# Patient Record
Sex: Female | Born: 1980 | Race: White | Hispanic: No | Marital: Married | Smoking: Never smoker
Health system: Southern US, Community
[De-identification: ages and names within clinical notes are randomized; demographics above are authoritative.]

## PROBLEM LIST (undated history)

## (undated) DIAGNOSIS — O139 Gestational [pregnancy-induced] hypertension without significant proteinuria, unspecified trimester: Secondary | ICD-10-CM

---

## 2001-08-23 ENCOUNTER — Other Ambulatory Visit: Admission: RE | Admit: 2001-08-23 | Discharge: 2001-08-23 | Payer: Self-pay | Admitting: Family Medicine

## 2002-08-26 ENCOUNTER — Other Ambulatory Visit: Admission: RE | Admit: 2002-08-26 | Discharge: 2002-08-26 | Payer: Self-pay | Admitting: Family Medicine

## 2003-10-16 ENCOUNTER — Other Ambulatory Visit: Admission: RE | Admit: 2003-10-16 | Discharge: 2003-10-16 | Payer: Self-pay | Admitting: Family Medicine

## 2004-11-29 ENCOUNTER — Ambulatory Visit: Payer: Self-pay | Admitting: Family Medicine

## 2004-11-29 ENCOUNTER — Other Ambulatory Visit: Admission: RE | Admit: 2004-11-29 | Discharge: 2004-11-29 | Payer: Self-pay | Admitting: Family Medicine

## 2006-02-23 ENCOUNTER — Other Ambulatory Visit: Admission: RE | Admit: 2006-02-23 | Discharge: 2006-02-23 | Payer: Self-pay | Admitting: Family Medicine

## 2006-02-23 ENCOUNTER — Ambulatory Visit: Payer: Self-pay | Admitting: Family Medicine

## 2006-06-26 ENCOUNTER — Ambulatory Visit: Payer: Self-pay | Admitting: Family Medicine

## 2006-10-12 ENCOUNTER — Ambulatory Visit: Payer: Self-pay | Admitting: Family Medicine

## 2007-05-08 ENCOUNTER — Encounter: Payer: Self-pay | Admitting: Family Medicine

## 2007-05-08 DIAGNOSIS — T7840XA Allergy, unspecified, initial encounter: Secondary | ICD-10-CM | POA: Insufficient documentation

## 2007-05-08 DIAGNOSIS — E162 Hypoglycemia, unspecified: Secondary | ICD-10-CM

## 2007-05-21 ENCOUNTER — Ambulatory Visit: Payer: Self-pay | Admitting: Family Medicine

## 2007-05-21 ENCOUNTER — Other Ambulatory Visit: Admission: RE | Admit: 2007-05-21 | Discharge: 2007-05-21 | Payer: Self-pay | Admitting: Family Medicine

## 2007-05-21 ENCOUNTER — Encounter: Payer: Self-pay | Admitting: Family Medicine

## 2007-05-29 ENCOUNTER — Encounter (INDEPENDENT_AMBULATORY_CARE_PROVIDER_SITE_OTHER): Payer: Self-pay | Admitting: *Deleted

## 2007-05-29 LAB — CONVERTED CEMR LAB: Pap Smear: NORMAL

## 2007-07-10 ENCOUNTER — Ambulatory Visit: Payer: Self-pay | Admitting: Family Medicine

## 2007-07-10 LAB — CONVERTED CEMR LAB
Glucose, Urine, Semiquant: NEGATIVE
Specific Gravity, Urine: 1.015

## 2007-09-27 ENCOUNTER — Ambulatory Visit: Payer: Self-pay | Admitting: Family Medicine

## 2007-09-27 DIAGNOSIS — J069 Acute upper respiratory infection, unspecified: Secondary | ICD-10-CM | POA: Insufficient documentation

## 2007-09-27 DIAGNOSIS — N760 Acute vaginitis: Secondary | ICD-10-CM | POA: Insufficient documentation

## 2007-09-27 DIAGNOSIS — N390 Urinary tract infection, site not specified: Secondary | ICD-10-CM | POA: Insufficient documentation

## 2008-07-09 ENCOUNTER — Telehealth (INDEPENDENT_AMBULATORY_CARE_PROVIDER_SITE_OTHER): Payer: Self-pay | Admitting: *Deleted

## 2008-12-22 ENCOUNTER — Ambulatory Visit: Payer: Self-pay | Admitting: Family Medicine

## 2008-12-22 LAB — CONVERTED CEMR LAB
Casts: 0 /lpf
Glucose, Urine, Semiquant: NEGATIVE
Ketones, urine, test strip: NEGATIVE
Protein, U semiquant: NEGATIVE
RBC / HPF: 0
WBC Urine, dipstick: NEGATIVE
pH: 6.5

## 2009-01-20 ENCOUNTER — Other Ambulatory Visit: Admission: RE | Admit: 2009-01-20 | Discharge: 2009-01-20 | Payer: Self-pay | Admitting: Family Medicine

## 2009-01-20 ENCOUNTER — Ambulatory Visit: Payer: Self-pay | Admitting: Family Medicine

## 2009-01-20 ENCOUNTER — Encounter: Payer: Self-pay | Admitting: Family Medicine

## 2009-01-20 LAB — CONVERTED CEMR LAB
Casts: 0 /lpf
Epithelial cells, urine: 1 /lpf
Protein, U semiquant: NEGATIVE
Specific Gravity, Urine: 1.01
Urobilinogen, UA: 0.2
WBC Urine, dipstick: NEGATIVE
Yeast, UA: 0

## 2009-01-20 LAB — HM PAP SMEAR

## 2009-01-21 ENCOUNTER — Encounter: Payer: Self-pay | Admitting: Family Medicine

## 2009-01-23 LAB — CONVERTED CEMR LAB: Pap Smear: NORMAL

## 2009-01-25 LAB — CONVERTED CEMR LAB
AST: 17 units/L (ref 0–37)
BUN: 9 mg/dL (ref 6–23)
Basophils Relative: 0 % (ref 0.0–3.0)
Calcium: 9.4 mg/dL (ref 8.4–10.5)
Cholesterol: 170 mg/dL (ref 0–200)
Creatinine, Ser: 0.8 mg/dL (ref 0.4–1.2)
Eosinophils Absolute: 0.2 10*3/uL (ref 0.0–0.7)
Eosinophils Relative: 2.3 % (ref 0.0–5.0)
HDL: 39.5 mg/dL (ref >39.0)
Lymphocytes Relative: 30.2 % (ref 12.0–46.0)
MCV: 93.3 fL (ref 78.0–100.0)
Monocytes Absolute: 0.4 10*3/uL (ref 0.1–1.0)
Neutro Abs: 4.6 10*3/uL (ref 1.4–7.7)
Neutrophils Relative %: 62.7 % (ref 43.0–77.0)
Potassium: 3.4 meq/L — ABNORMAL LOW (ref 3.5–5.1)
RDW: 11.4 % — ABNORMAL LOW (ref 11.5–14.6)
Sodium: 140 meq/L (ref 135–145)
Total Protein: 6.9 g/dL (ref 6.0–8.3)
VLDL: 16 mg/dL (ref 0–40)
WBC: 7.4 10*3/uL (ref 4.5–10.5)

## 2009-01-26 ENCOUNTER — Encounter (INDEPENDENT_AMBULATORY_CARE_PROVIDER_SITE_OTHER): Payer: Self-pay | Admitting: *Deleted

## 2009-02-18 ENCOUNTER — Ambulatory Visit: Payer: Self-pay | Admitting: Family Medicine

## 2009-02-18 LAB — CONVERTED CEMR LAB
Bilirubin Urine: NEGATIVE
Glucose, Urine, Semiquant: NEGATIVE
Protein, U semiquant: NEGATIVE
Urobilinogen, UA: 0.2
WBC Urine, dipstick: NEGATIVE

## 2009-02-20 ENCOUNTER — Encounter: Payer: Self-pay | Admitting: Family Medicine

## 2009-02-22 DIAGNOSIS — R3129 Other microscopic hematuria: Secondary | ICD-10-CM | POA: Insufficient documentation

## 2009-03-11 ENCOUNTER — Encounter: Payer: Self-pay | Admitting: Family Medicine

## 2009-05-05 ENCOUNTER — Inpatient Hospital Stay (HOSPITAL_COMMUNITY): Admission: AD | Admit: 2009-05-05 | Discharge: 2009-05-05 | Payer: Self-pay | Admitting: Family Medicine

## 2009-05-08 ENCOUNTER — Inpatient Hospital Stay (HOSPITAL_COMMUNITY): Admission: AD | Admit: 2009-05-08 | Discharge: 2009-05-08 | Payer: Self-pay | Admitting: Obstetrics & Gynecology

## 2009-05-11 ENCOUNTER — Inpatient Hospital Stay (HOSPITAL_COMMUNITY): Admission: AD | Admit: 2009-05-11 | Discharge: 2009-05-11 | Payer: Self-pay | Admitting: Obstetrics & Gynecology

## 2009-05-14 ENCOUNTER — Inpatient Hospital Stay (HOSPITAL_COMMUNITY): Admission: AD | Admit: 2009-05-14 | Discharge: 2009-05-14 | Payer: Self-pay | Admitting: Obstetrics & Gynecology

## 2009-12-30 ENCOUNTER — Encounter: Payer: Self-pay | Admitting: Family Medicine

## 2010-05-17 ENCOUNTER — Inpatient Hospital Stay (HOSPITAL_COMMUNITY): Admission: AD | Admit: 2010-05-17 | Discharge: 2010-05-17 | Payer: Self-pay | Admitting: Obstetrics and Gynecology

## 2010-05-18 ENCOUNTER — Encounter (INDEPENDENT_AMBULATORY_CARE_PROVIDER_SITE_OTHER): Payer: Self-pay | Admitting: Obstetrics and Gynecology

## 2010-05-18 ENCOUNTER — Inpatient Hospital Stay (HOSPITAL_COMMUNITY): Admission: AD | Admit: 2010-05-18 | Discharge: 2010-05-21 | Payer: Self-pay | Admitting: Obstetrics and Gynecology

## 2010-11-16 IMAGING — US US OB TRANSVAGINAL MODIFY
1 series · 13 of 28 positions shown · non-contrast
Comparison: None available

CLINICAL DATA: Pregnant patient.  Quantitative HCG level not
changing.  Bleeding.  Beta HCG level is reportedly 21 today and was
23 on 05/05/2009.

OBSTETRIC <14 WK US AND TRANSVAGINAL OB US
TECHNIQUE: Both transabdominal and transvaginal ultrasound
examinations were performed for complete evaluation of the
gestation as well as the maternal uterus, adnexal regions, and
pelvic cul-de-sac.

[Series 1: us ob comp less 14 wks · 0.21mm/px · 13 of 38 slices shown]
[im 2/38]
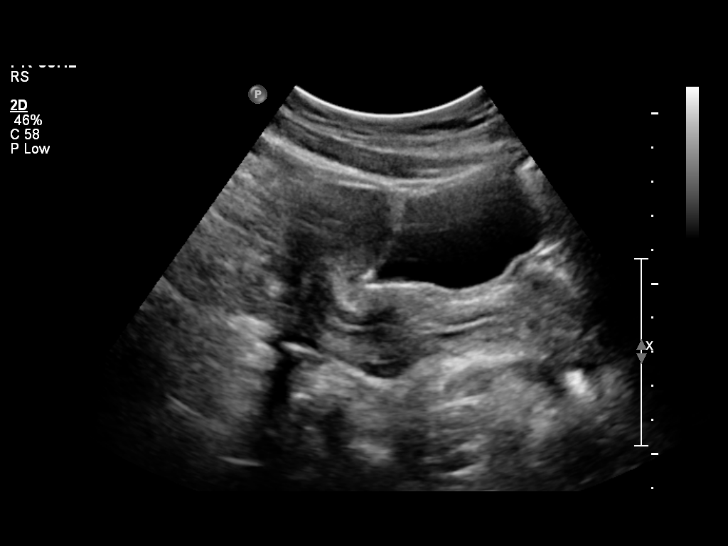
[im 5/38]
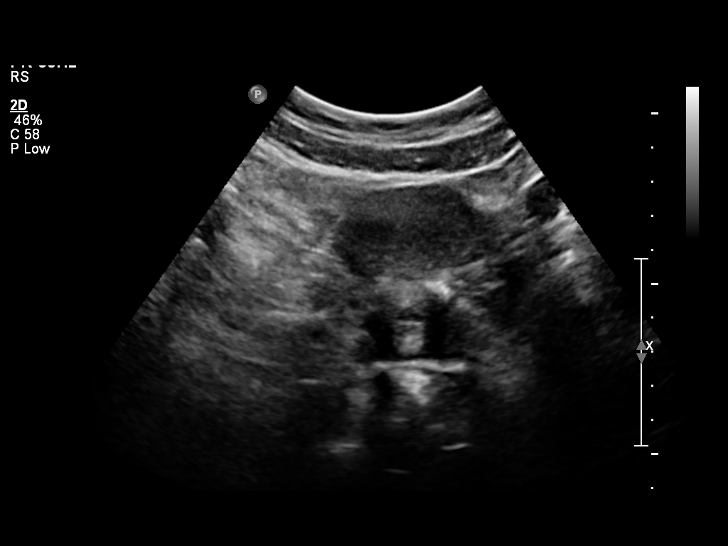
[im 7/38]
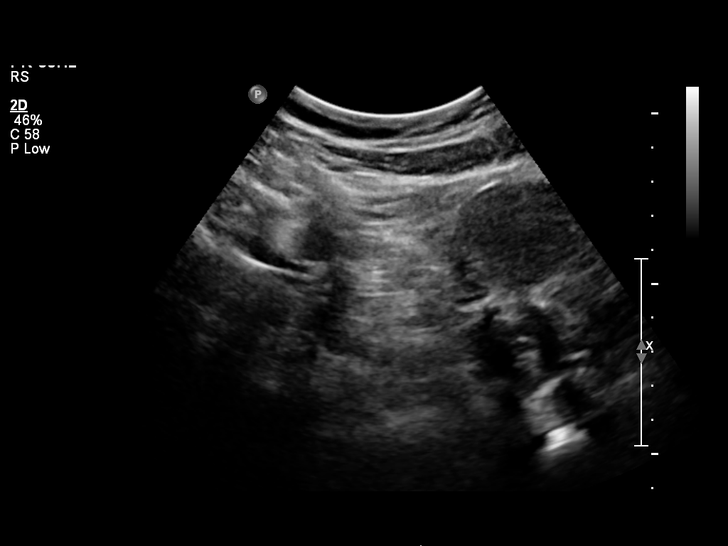
[im 10/38]
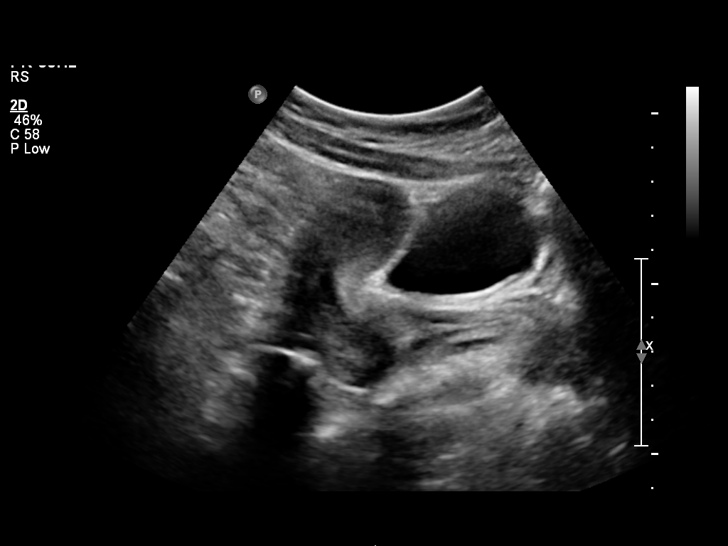
[im 13/38]
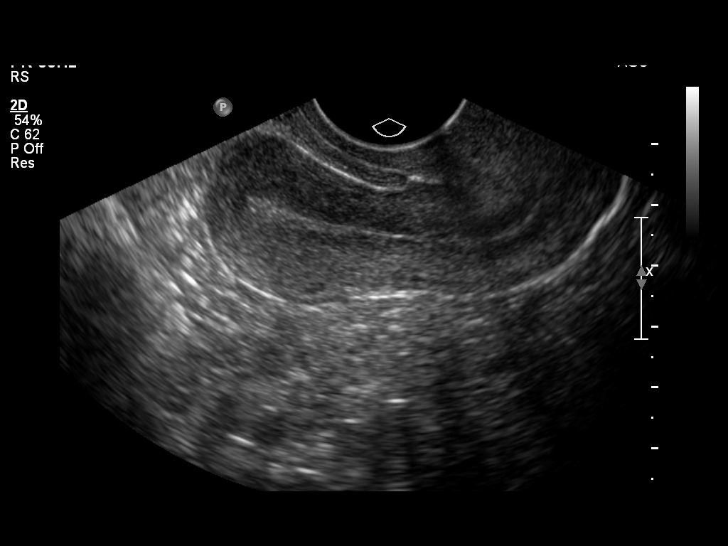
[im 16/38]
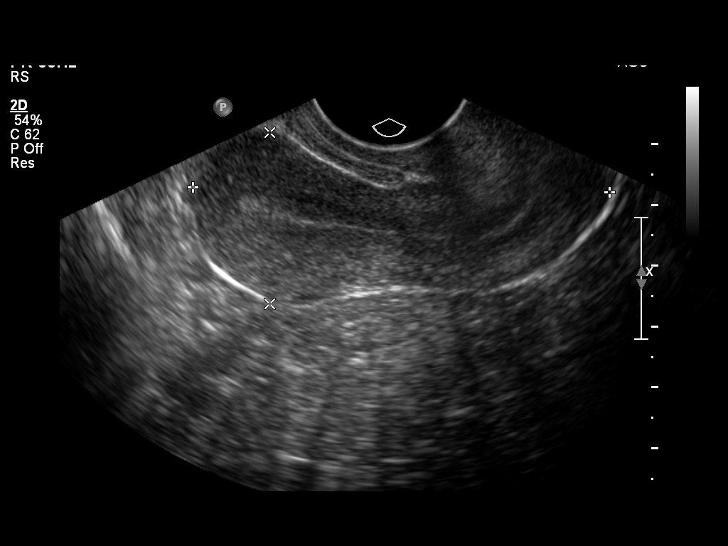
[im 20/38]
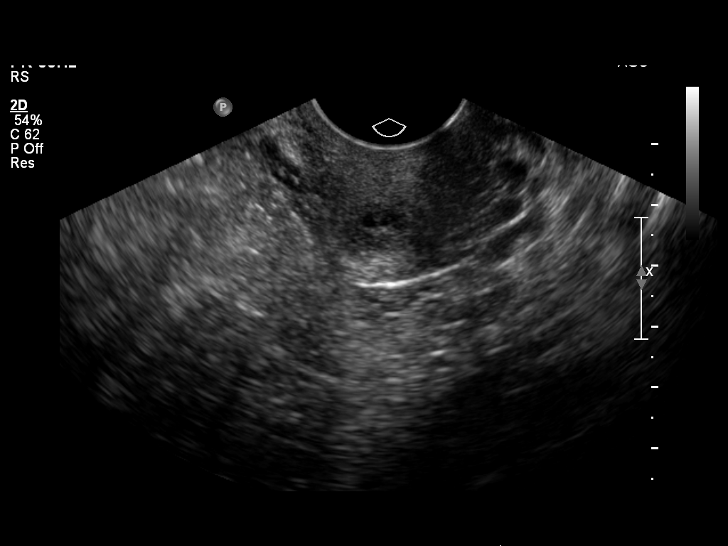
[im 22/38]
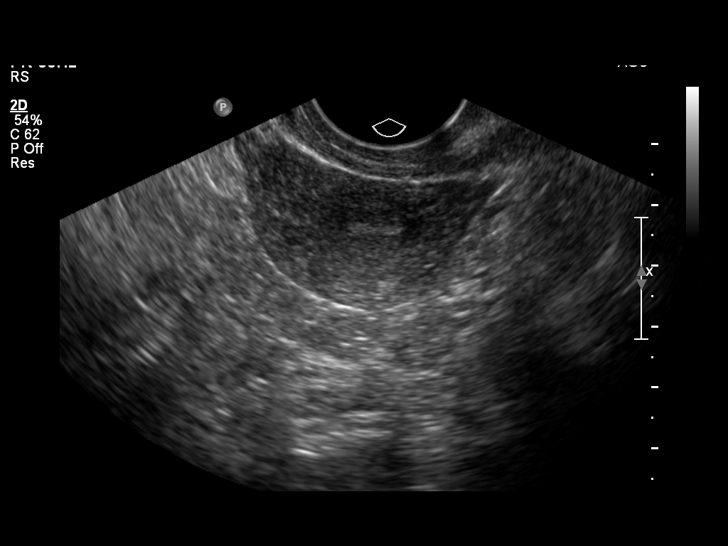
[im 25/38]
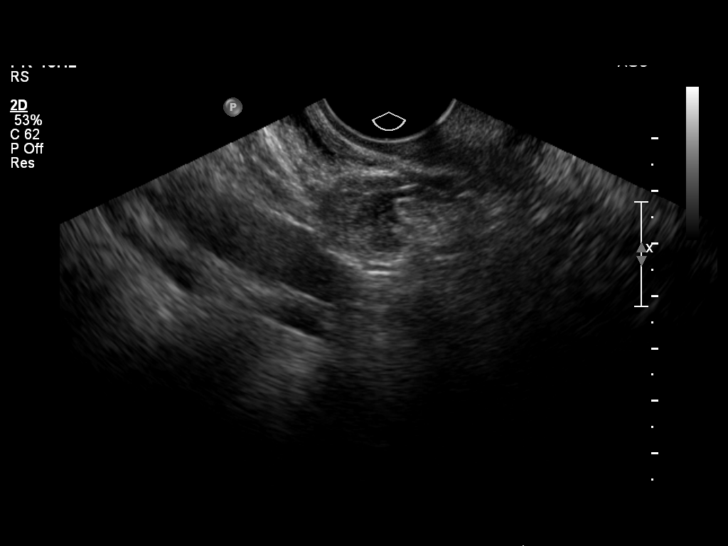
[im 28/38]
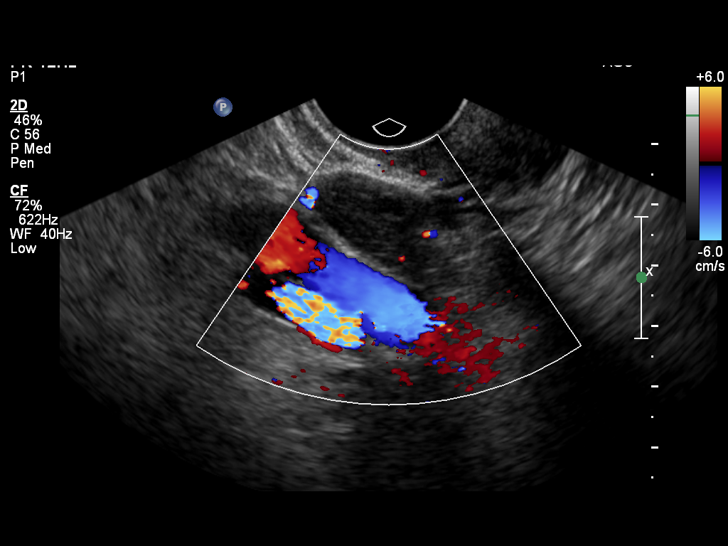
[im 31/38]
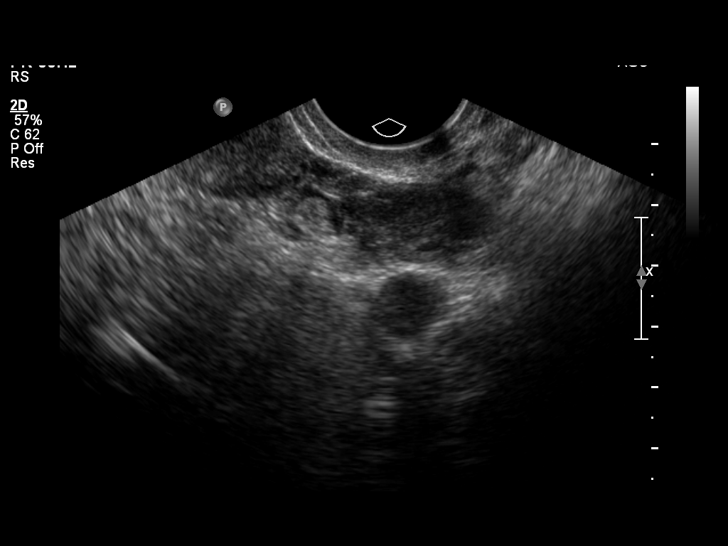
[im 33/38]
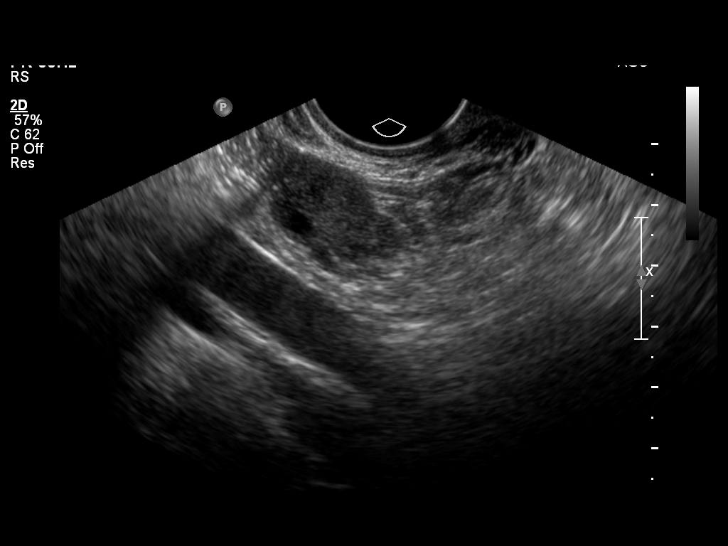
[im 36/38]
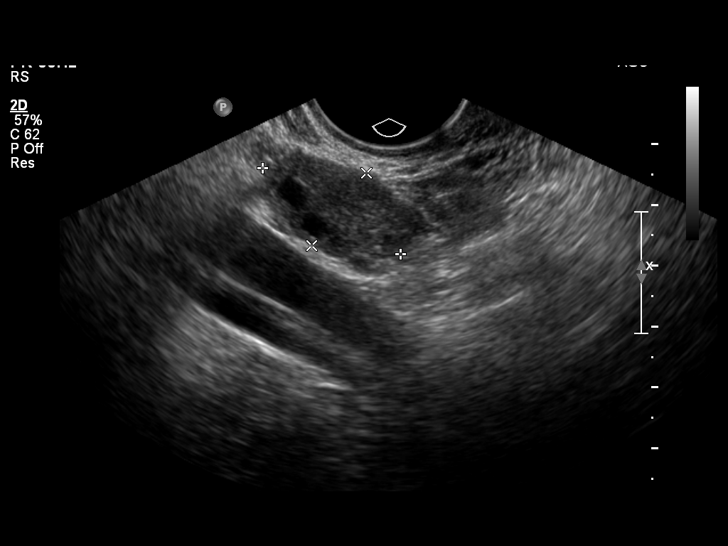

[13 of 28 positions shown; findings below may reference images not displayed]

Intrauterine gestational sac: None
Yolk sac: None
Embryo: None

Maternal uterus/adnexae:
The uterus is normal in size measuring 6.7 cm in length by
centers AP diameter by 3.7 cm transverse diameter.  No uterine mass
is identified.  The endometrial stripe is 1-2 mm in thickness.

Both maternal ovaries are normal in size and appearance, measuring
2.7 x 1.5 x 2.5 cm on the right and 2.6 x 1.4 x 1.8 cm on the left.
Normal appearing follicles are seen bilaterally.

No free pelvic fluid is identified.
IMPRESSION: No intrauterine pregnancy is identified.  Given the quantitative
beta HCG level of 21, we would not expect to see a gestational sac
at this time.  No adnexal mass or free fluid is seen.
Considerations include a normal early intrauterine pregnancy,
spontaneous abortion, or occult ectopic pregnancy.

## 2011-01-24 NOTE — Letter (Signed)
Summary: [facility]  [facility]   Imported By: Lanelle Bal 01/07/2010 14:13:18  _____________________________________________________________________  External Attachment:    Type:   Image     Comment:   External Document

## 2011-02-15 ENCOUNTER — Ambulatory Visit: Payer: Self-pay | Admitting: Family Medicine

## 2011-03-13 LAB — COMPREHENSIVE METABOLIC PANEL
ALT: 21 U/L (ref 0–35)
Albumin: 3.2 g/dL — ABNORMAL LOW (ref 3.5–5.2)
Alkaline Phosphatase: 106 U/L (ref 39–117)
BUN: 12 mg/dL (ref 6–23)
CO2: 22 mEq/L (ref 19–32)
Calcium: 9.5 mg/dL (ref 8.4–10.5)
Chloride: 106 mEq/L (ref 96–112)
Creatinine, Ser: 0.62 mg/dL (ref 0.4–1.2)
GFR calc non Af Amer: >60 mL/min (ref >60)
Sodium: 135 mEq/L (ref 135–145)
Total Bilirubin: 0.4 mg/dL (ref 0.3–1.2)
Total Protein: 6.7 g/dL (ref 6.0–8.3)

## 2011-03-13 LAB — CBC
HCT: 34.7 % — ABNORMAL LOW (ref 36.0–46.0)
HCT: 38.7 % (ref 36.0–46.0)
MCHC: 34.7 g/dL (ref 30.0–36.0)
MCHC: 34.7 g/dL (ref 30.0–36.0)
MCV: 94.6 fL (ref 78.0–100.0)
Platelets: 192 10*3/uL (ref 150–400)
RBC: 4.09 MIL/uL (ref 3.87–5.11)
RDW: 13.3 % (ref 11.5–15.5)
RDW: 13.4 % (ref 11.5–15.5)
WBC: 10.3 10*3/uL (ref 4.0–10.5)

## 2011-03-13 LAB — URINALYSIS, ROUTINE W REFLEX MICROSCOPIC
Nitrite: NEGATIVE
Protein, ur: NEGATIVE mg/dL

## 2011-03-13 LAB — URINE MICROSCOPIC-ADD ON

## 2011-03-13 LAB — RPR: RPR Ser Ql: NONREACTIVE

## 2011-04-04 LAB — CBC
HCT: 38.1 % (ref 36.0–46.0)
Hemoglobin: 13.5 g/dL (ref 12.0–15.0)
MCHC: 34.7 g/dL (ref 30.0–36.0)
MCV: 95.3 fL (ref 78.0–100.0)
RBC: 4 MIL/uL (ref 3.87–5.11)
RBC: 4.05 MIL/uL (ref 3.87–5.11)
WBC: 12.4 10*3/uL — ABNORMAL HIGH (ref 4.0–10.5)
WBC: 5.7 10*3/uL (ref 4.0–10.5)

## 2011-04-04 LAB — DIFFERENTIAL
Basophils Absolute: 0 10*3/uL (ref 0.0–0.1)
Basophils Relative: 1 % (ref 0–1)
Lymphocytes Relative: 28 % (ref 12–46)
Monocytes Absolute: 0.5 10*3/uL (ref 0.1–1.0)
Neutro Abs: 3.4 10*3/uL (ref 1.7–7.7)
Neutrophils Relative %: 60 % (ref 43–77)

## 2011-04-04 LAB — CREATININE, SERUM
Creatinine, Ser: 0.71 mg/dL (ref 0.4–1.2)
GFR calc Af Amer: >60 mL/min (ref >60)
GFR calc non Af Amer: >60 mL/min (ref >60)

## 2011-04-04 LAB — HCG, QUANTITATIVE, PREGNANCY
hCG, Beta Chain, Quant, S: 19 m[IU]/mL — ABNORMAL HIGH (ref <5)
hCG, Beta Chain, Quant, S: 21 m[IU]/mL — ABNORMAL HIGH (ref <5)
hCG, Beta Chain, Quant, S: 23 m[IU]/mL — ABNORMAL HIGH (ref <5)

## 2011-04-04 LAB — ABO/RH: ABO/RH(D): O POS

## 2011-04-04 LAB — BUN: BUN: 9 mg/dL (ref 6–23)

## 2011-04-04 LAB — AST: AST: 22 U/L (ref 0–37)

## 2011-04-04 LAB — WET PREP, GENITAL

## 2011-12-20 ENCOUNTER — Encounter: Payer: Self-pay | Admitting: Family Medicine

## 2011-12-20 ENCOUNTER — Ambulatory Visit (INDEPENDENT_AMBULATORY_CARE_PROVIDER_SITE_OTHER): Payer: BC Managed Care – PPO | Admitting: Family Medicine

## 2011-12-20 VITALS — BP 102/64 | HR 92 | Temp 98.3°F | Resp 20 | Ht 58.5 in | Wt 113.2 lb

## 2011-12-20 DIAGNOSIS — J0100 Acute maxillary sinusitis, unspecified: Secondary | ICD-10-CM | POA: Insufficient documentation

## 2011-12-20 DIAGNOSIS — J40 Bronchitis, not specified as acute or chronic: Secondary | ICD-10-CM

## 2011-12-20 MED ORDER — GUAIFENESIN-CODEINE 100-10 MG/5ML PO SYRP: 10.0000 mL | ORAL_SOLUTION | Freq: Every evening | ORAL | Status: AC | PRN

## 2011-12-20 MED ORDER — BENZONATATE 200 MG PO CAPS: 200.0000 mg | ORAL_CAPSULE | Freq: Three times a day (TID) | ORAL | Status: AC | PRN

## 2011-12-20 NOTE — Patient Instructions (Signed)
Drink lots of fluids and get rest  Take tessalon during the day  Take robitussin ac at night - caution of sedation If worse / fever or not starting to improve in a week please call

## 2011-12-20 NOTE — Assessment & Plan Note (Signed)
Acute/ viral with persistant/ cyclic cough and reassuring exam  tx cough aggressively with tessalon in day and guifen cod in pm  Fluids/ rest Disc symptomatic care - see instructions on AVS  Adv to update if worse or not improving

## 2011-12-20 NOTE — Progress Notes (Signed)
  Subjective:    Patient ID: Jade Lowe, female    DOB: 1981-01-30, 30 y.o.   MRN: [id number]  HPI Started getting uri symptoms 3 weeks ago - and cough is just not getting better Just a little bit prod today- yellow and dark looking  Throat is raw from coughing - hurts a bit to swallow A fever Monday night -- ? Mild  No fever now  No ear pain  No n/v/d  Tried otc meds - see list -- day quil works the best  Nose is not too congested  No headache   Patient Active Problem List  Diagnoses  . HYPOGLYCEMIA  . MICROSCOPIC HEMATURIA  . ALLERGY  . Bronchitis   No past medical history on file. No past surgical history on file. History  Substance Use Topics  . Smoking status: Never Smoker   . Smokeless tobacco: Not on file  . Alcohol Use: No   Family History  Problem Relation Age of Onset  . Diabetes Mother   . Cancer Paternal Aunt     Bca (? breast cancer abbreviation)   No Known Allergies No current outpatient prescriptions on file prior to visit.      Review of Systems Review of Systems  Constitutional: Negative for fever, appetite change, fatigue and unexpected weight change.  Eyes: Negative for pain and visual disturbance.  ENT neg for ear pain / neck pain or eye redness or d/c Respiratory: Negative for sob or wheeze or stridor  Cardiovascular: Negative for cp or palpitations    Gastrointestinal: Negative for nausea, diarrhea and constipation.  Genitourinary: Negative for urgency and frequency.  Skin: Negative for pallor or rash   Neurological: Negative for weakness, light-headedness, numbness and headaches.  Hematological: Negative for adenopathy. Does not bruise/bleed easily.  Psychiatric/Behavioral: Negative for dysphoric mood. The patient is not nervous/anxious.          Objective:   Physical Exam  Constitutional: She appears well-developed and well-nourished. No distress.       Harsh cough  HENT:  Head: Normocephalic and atraumatic.  Right Ear:  External ear normal.  Left Ear: External ear normal.  Mouth/Throat: Oropharynx is clear and moist.       Nares are injected and congested  No sinus tenderness Some post nasal drainage that is clear  Eyes: Conjunctivae and EOM are normal. Pupils are equal, round, and reactive to light. Right eye exhibits no discharge. Left eye exhibits no discharge.  Neck: Normal range of motion. Neck supple. No JVD present. No thyromegaly present.  Cardiovascular: Normal rate, regular rhythm and normal heart sounds.   Pulmonary/Chest: Effort normal and breath sounds normal. No respiratory distress. She has no wheezes. She has no rales. She exhibits no tenderness.       bs are harsh at bases No rhonchi or wheeze  Lymphadenopathy:    She has no cervical adenopathy.  Skin: Skin is warm and dry. No rash noted. No erythema. No pallor.  Psychiatric: She has a normal mood and affect.          Assessment & Plan:

## 2012-11-19 ENCOUNTER — Ambulatory Visit (INDEPENDENT_AMBULATORY_CARE_PROVIDER_SITE_OTHER): Payer: BC Managed Care – PPO | Admitting: Family Medicine

## 2012-11-19 ENCOUNTER — Encounter: Payer: Self-pay | Admitting: Family Medicine

## 2012-11-19 VITALS — BP 122/84 | HR 68 | Temp 98.3°F | Wt 119.0 lb

## 2012-11-19 DIAGNOSIS — J0100 Acute maxillary sinusitis, unspecified: Secondary | ICD-10-CM

## 2012-11-19 MED ORDER — AMOXICILLIN-POT CLAVULANATE 875-125 MG PO TABS: 1.0000 | ORAL_TABLET | Freq: Two times a day (BID) | ORAL | Status: AC

## 2012-11-19 NOTE — Assessment & Plan Note (Signed)
Given duration anticipate bacterial sinusitis. Will treat with augmentin course for 10 days Supportive care as per instructions. Pt agrees with plan, if worsening to notify us.

## 2012-11-19 NOTE — Patient Instructions (Signed)
You have a sinus infection. Take medicine as prescribed: augmentin for 10 day course Push fluids and plenty of rest. Nasal saline irrigation or neti pot to help drain sinuses. May use simple mucinex with plenty of fluid to help mobilize mucous. Let us know if fever >101.5, trouble opening/closing mouth, difficulty swallowing, or worsening - you may need to be seen again.

## 2012-11-19 NOTE — Progress Notes (Signed)
  Subjective:    Patient ID: Jade Lowe, female    DOB: 03/14/1981, 31 y.o.   MRN: [id number]  HPI CC: sinusitis?  9d h/o sinus congestion, PNDrainage worse in am, sinus pressure.  Started with ST.  Some nausea in am.  No cough, fevers/chills, abd pain, n, ear or tooth pain.  Tried mucinex, didn't help.  Also taking dayquil and tylenol cold/flu.  No smokers at home.  Husband smokes outside. No h/o asthma. Daughter recently with RN (2yo).  On OCP, was on abx 2 wks ago for UTI (macrobid) Doesn't think could be pregnant - LMP was last week, only lasted 1 day, very light.  No past medical history on file.   Review of Systems Per HPI    Objective:   Physical Exam  Nursing note and vitals reviewed. Constitutional: She appears well-developed and well-nourished. No distress.  HENT:  Head: Normocephalic and atraumatic.  Right Ear: Hearing, tympanic membrane, external ear and ear canal normal.  Left Ear: Hearing, tympanic membrane, external ear and ear canal normal.  Nose: No mucosal edema or rhinorrhea. Right sinus exhibits maxillary sinus tenderness. Right sinus exhibits no frontal sinus tenderness. Left sinus exhibits maxillary sinus tenderness. Left sinus exhibits no frontal sinus tenderness.  Mouth/Throat: Uvula is midline, oropharynx is clear and moist and mucous membranes are normal. No oropharyngeal exudate, posterior oropharyngeal edema, posterior oropharyngeal erythema or tonsillar abscesses.  Eyes: Conjunctivae normal and EOM are normal. Pupils are equal, round, and reactive to light. No scleral icterus.  Neck: Normal range of motion. Neck supple.  Cardiovascular: Normal rate, regular rhythm, normal heart sounds and intact distal pulses.   No murmur heard. Pulmonary/Chest: Effort normal and breath sounds normal. No respiratory distress. She has no wheezes. She has no rales.  Lymphadenopathy:    She has no cervical adenopathy.  Skin: Skin is warm and dry. No rash noted.        Assessment & Plan:

## 2013-09-25 LAB — OB RESULTS CONSOLE ABO/RH: RH TYPE: POSITIVE

## 2013-09-25 LAB — OB RESULTS CONSOLE HEPATITIS B SURFACE ANTIGEN: HEP B S AG: NEGATIVE

## 2013-09-25 LAB — OB RESULTS CONSOLE RUBELLA ANTIBODY, IGM: RUBELLA: IMMUNE

## 2013-09-25 LAB — OB RESULTS CONSOLE HIV ANTIBODY (ROUTINE TESTING): HIV: NONREACTIVE

## 2013-09-25 LAB — OB RESULTS CONSOLE ANTIBODY SCREEN: ANTIBODY SCREEN: NEGATIVE

## 2013-09-25 LAB — OB RESULTS CONSOLE RPR: RPR: NONREACTIVE

## 2014-03-16 ENCOUNTER — Encounter: Payer: Self-pay | Admitting: Internal Medicine

## 2014-03-16 ENCOUNTER — Ambulatory Visit (INDEPENDENT_AMBULATORY_CARE_PROVIDER_SITE_OTHER): Payer: BC Managed Care – PPO | Admitting: Internal Medicine

## 2014-03-16 VITALS — BP 104/64 | HR 96 | Temp 98.1°F | Wt 146.8 lb

## 2014-03-16 DIAGNOSIS — J019 Acute sinusitis, unspecified: Secondary | ICD-10-CM

## 2014-03-16 MED ORDER — AMOXICILLIN-POT CLAVULANATE 875-125 MG PO TABS: 1.0000 | ORAL_TABLET | Freq: Two times a day (BID) | ORAL | Status: DC

## 2014-03-16 NOTE — Progress Notes (Signed)
HPI  Pt presents to the clinic today with c/o head congestion, sinus pain and pressure and cough. She reports this started 3 weeks ago. The cough is productive of thick yellow mucous. She is having fever but denies chills or body aches. She has tried delsym and Mucinex. She has no history of allergies or breathing problems. She does not smoke.  Review of Systems   History reviewed. No pertinent past medical history.  Family History  Problem Relation Age of Onset  . Diabetes Mother   . Cancer Paternal Aunt     Bca (? breast cancer abbreviation)    History   Social History  . Marital Status: Married    Spouse Name: N/A    Number of Children: N/A  . Years of Education: N/A   Occupational History  . Not on file.   Social History Main Topics  . Smoking status: Never Smoker   . Smokeless tobacco: Never Used  . Alcohol Use: No  . Drug Use: No  . Sexual Activity: Not on file   Other Topics Concern  . Not on file   Social History Narrative  . No narrative on file    No Known Allergies   Constitutional: Positive headache, fatigue and fever. Denies abrupt weight changes.  HEENT:  Positive eye pain, pressure behind the eyes, facial pain, nasal congestion and sore throat. Denies eye redness, ear pain, ringing in the ears, wax buildup, runny nose or bloody nose. Respiratory: Positive cough. Denies difficulty breathing or shortness of breath.  Cardiovascular: Denies chest pain, chest tightness, palpitations or swelling in the hands or feet.   No other specific complaints in a complete review of systems (except as listed in HPI above).  Objective:  BP 104/64  Pulse 96  Temp(Src) 98.1 F (36.7 C) (Oral)  Wt 146 lb 12 oz (66.565 kg)  SpO2 97%   General: Appears his stated age, well developed, well nourished in NAD. HEENT: Head: normal shape and size, maxillarysinus tenderness noted; Eyes: sclera white, no icterus, conjunctiva pink, PERRLA and EOMs intact; Ears: Tm's gray and  intact, normal light reflex; Nose: mucosa pink and moist, septum midline; Throat/Mouth: + PND. Teeth present, mucosa pink and moist, no exudate noted, no lesions or ulcerations noted.  Neck: Neck supple, trachea midline. No massses, lumps or thyromegaly present.  Cardiovascular: Normal rate and rhythm. S1,S2 noted.  No murmur, rubs or gallops noted. No JVD or BLE edema. No carotid bruits noted. Pulmonary/Chest: Normal effort and positive vesicular breath sounds. No respiratory distress. No wheezes, rales or ronchi noted.      Assessment & Plan:   Acute bacterial sinusitis  Can use a Neti Pot which can be purchased from your local drug store. Augmentin BID for 10 days  RTC as needed or if symptoms persist.

## 2014-03-16 NOTE — Patient Instructions (Addendum)

## 2014-03-16 NOTE — Progress Notes (Signed)
Pre visit review using our clinic review tool, if applicable. No additional management support is needed unless otherwise documented below in the visit note. 

## 2014-04-02 ENCOUNTER — Ambulatory Visit (INDEPENDENT_AMBULATORY_CARE_PROVIDER_SITE_OTHER): Payer: BC Managed Care – PPO | Admitting: Internal Medicine

## 2014-04-02 ENCOUNTER — Encounter: Payer: Self-pay | Admitting: Internal Medicine

## 2014-04-02 VITALS — BP 106/70 | HR 85 | Temp 98.0°F | Wt 148.0 lb

## 2014-04-02 DIAGNOSIS — B309 Viral conjunctivitis, unspecified: Secondary | ICD-10-CM

## 2014-04-02 DIAGNOSIS — J309 Allergic rhinitis, unspecified: Secondary | ICD-10-CM

## 2014-04-02 NOTE — Patient Instructions (Addendum)
Viral Conjunctivitis °Conjunctivitis is an irritation (inflammation) of the clear membrane that covers the white part of the eye (the conjunctiva). The irritation can also happen on the underside of the eyelids. Conjunctivitis makes the eye red or pink in color. This is what is commonly known as pink eye. Viral conjunctivitis can spread easily (contagious). °CAUSES  °· Infection from virus on the surface of the eye. °· Infection from the irritation or injury of nearby tissues such as the eyelids or cornea. °· More serious inflammation or infection on the inside of the eye. °· Other eye diseases. °· The use of certain eye medications. °SYMPTOMS  °The normally white color of the eye or the underside of the eyelid is usually pink or red in color. The pink eye is usually associated with irritation, tearing and some sensitivity to light. Viral conjunctivitis is often associated with a clear, watery discharge. If a discharge is present, there may also be some blurred vision in the affected eye. °DIAGNOSIS  °Conjunctivitis is diagnosed by an eye exam. The eye specialist looks for changes in the surface tissues of the eye which take on changes characteristic of the specific types of conjunctivitis. A sample of any discharge may be collected on a Q-Tip (sterile swap). The sample will be sent to a lab to see whether or not the inflammation is caused by bacterial or viral infection. °TREATMENT  °Viral conjunctivitis will not respond to medicines that kill germs (antibiotics). Treatment is aimed at stopping a bacterial infection on top of the viral infection. The goal of treatment is to relieve symptoms (such as itching) with antihistamine drops or other eye medications.  °HOME CARE INSTRUCTIONS  °· To ease discomfort, apply a cool, clean wash cloth to your eye for 10 to 20 minutes, 3 to 4 times a day. °· Gently wipe away any drainage from the eye with a warm, wet washcloth or a cotton ball. °· Wash your hands often with soap  and use paper towels to dry. °· Do not share towels or washcloths. This may spread the infection. °· Change or wash your pillowcase every day. °· You should not use eye make-up until the infection is gone. °· Stop using contacts lenses. Ask your eye professional how to sterilize or replace them before using again. This depends on the type of contact lenses used. °· Do not touch the edge of the eyelid with the eye drop bottle or ointment tube when applying medications to the affected eye. This will stop you from spreading the infection to the other eye or to others. °SEEK IMMEDIATE MEDICAL CARE IF:  °· The infection has not improved within 3 days of beginning treatment. °· A watery discharge from the eye develops. °· Pain in the eye increases. °· The redness is spreading. °· Vision becomes blurred. °· An oral temperature above 102° F (38.9° C) develops, or as your caregiver suggests. °· Facial pain, redness or swelling develops. °· Any problems that may be related to the prescribed medicine develop. °MAKE SURE YOU:  °· Understand these instructions. °· Will watch your condition. °· Will get help right away if you are not doing well or get worse. °Document Released: 12/11/2005 Document Revised: 03/04/2012 Document Reviewed: 07/30/2008 °[employer]® Patient Information ©2014 [employer]. ° °

## 2014-04-02 NOTE — Progress Notes (Signed)
HPI  Pt presents to the clinic today wit hc/o recurrent facial pain and pressure and headache. She reports this started 4 days ago. She has also noticed her eyes are red and irritated. She has noticed that she has waken up with her eyes crusted shut. She was recently treated for a sinus infection 03/16/14 with amoxicillin. Her OB also called her in some tessalon pearls. She denies fever, chills, body aches. She has not had sick contacts.  Review of Systems   History reviewed. No pertinent past medical history.  Family History  Problem Relation Age of Onset  . Diabetes Mother   . Cancer Paternal Aunt     Bca (? breast cancer abbreviation)    History   Social History  . Marital Status: Married    Spouse Name: N/A    Number of Children: N/A  . Years of Education: N/A   Occupational History  . Not on file.   Social History Main Topics  . Smoking status: Never Smoker   . Smokeless tobacco: Never Used  . Alcohol Use: No  . Drug Use: No  . Sexual Activity: Not on file   Other Topics Concern  . Not on file   Social History Narrative  . No narrative on file    No Known Allergies   Constitutional: Positive headache, fatigue. Denies fever or abrupt weight changes.  HEENT:  Positive eye pain, pressure behind the eyes, facial pain, nasal congestion and sore throat. Denies ear pain, ringing in the ears, wax buildup, runny nose or bloody nose. Respiratory: Positive cough. Denies difficulty breathing or shortness of breath.  Cardiovascular: Denies chest pain, chest tightness, palpitations or swelling in the hands or feet.   No other specific complaints in a complete review of systems (except as listed in HPI above).  Objective:    General: Appears erhstated age, pregnant in NAD. HEENT: Head: normal shape and size, sinus tenderness noted; Eyes: sclera injected, no icterus, conjunctiva erythematous, PERRLA and EOMs intact; Ears: Tm's gray and intact, normal light reflex; Nose:  mucosa pink and moist, septum midline; Throat/Mouth: + PND. Teeth present, mucosa pink and moist, no exudate noted, no lesions or ulcerations noted.  Neck: Neck supple, trachea midline. No massses, lumps or thyromegaly present.  Cardiovascular: Normal rate and rhythm. S1,S2 noted.  No murmur, rubs or gallops noted. No JVD or BLE edema. No carotid bruits noted. Pulmonary/Chest: Normal effort and positive vesicular breath sounds. No respiratory distress. No wheezes, rales or ronchi noted.      Assessment & Plan:   Allergic Rhinitis  Can use a Neti Pot which can be purchased from your local drug store. Flonase 2 sprays each nostril for 3 days and then as needed. Continue Tessalon for now  Viral conjunctivitis:  Advised warm compresses TID No indication for abx eye ointment  RTC as needed   RTC as needed or if symptoms persist.

## 2014-04-02 NOTE — Progress Notes (Signed)
Pre visit review using our clinic review tool, if applicable. No additional management support is needed unless otherwise documented below in the visit note. 

## 2014-04-08 LAB — OB RESULTS CONSOLE GBS: STREP GROUP B AG: NEGATIVE

## 2014-04-16 ENCOUNTER — Encounter (HOSPITAL_COMMUNITY): Payer: Self-pay | Admitting: Pharmacist

## 2014-04-22 NOTE — H&P (Signed)
Jaymes GraffSarah E Wenzler is a 33 y.o. female presenting for repeat cesarean section. History OB History   Grav Para Term Preterm Abortions TAB SAB Ect Mult Living   1              No past medical history on file. No past surgical history on file. Family History: family history includes Cancer in her paternal aunt; Diabetes in her mother. Social History:  reports that she has never smoked. She has never used smokeless tobacco. She reports that she does not drink alcohol or use illicit drugs.   Prenatal Transfer Tool  Maternal Diabetes: No Genetic Screening: Normal Maternal Ultrasounds/Referrals: Normal Fetal Ultrasounds or other Referrals:  None Maternal Substance Abuse:  No Significant Maternal Medications:  None Significant Maternal Lab Results:  None Other Comments:  None  Review of Systems  Eyes: Negative for blurred vision.  Gastrointestinal: Negative for vomiting.  Neurological: Negative for headaches.      There were no vitals taken for this visit. Exam Physical Exam  Cardiovascular: Normal rate and regular rhythm.   Respiratory: Effort normal and breath sounds normal.  GI: Soft. There is no tenderness.  Neurological: She has normal reflexes.    Prenatal labs: ABO, Rh:   Antibody:   Rubella:   RPR:    HBsAg:    HIV:    GBS:     Assessment/Plan: 33 yo G3P1 for repeat cesarean section. D/W patient risks including infection, organ damage, bleeding/transfusion-HIV/Hep, DVT/PE, pneumonia.   Roselle LocusJames E Abdou Stocks II 04/22/2014, 6:43 PM

## 2014-04-24 ENCOUNTER — Encounter (HOSPITAL_COMMUNITY): Payer: Self-pay | Admitting: *Deleted

## 2014-04-24 ENCOUNTER — Inpatient Hospital Stay (HOSPITAL_COMMUNITY)
Admission: AD | Admit: 2014-04-24 | Discharge: 2014-04-24 | Disposition: A | Discharge status: Home / Self Care | Payer: BC Managed Care – PPO | Source: Ambulatory Visit | Attending: Obstetrics and Gynecology | Admitting: Obstetrics and Gynecology

## 2014-04-24 ENCOUNTER — Encounter (HOSPITAL_COMMUNITY): Payer: Self-pay

## 2014-04-24 DIAGNOSIS — O479 False labor, unspecified: Secondary | ICD-10-CM | POA: Insufficient documentation

## 2014-04-24 DIAGNOSIS — O4690 Antepartum hemorrhage, unspecified, unspecified trimester: Secondary | ICD-10-CM | POA: Insufficient documentation

## 2014-04-24 HISTORY — DX: Gestational (pregnancy-induced) hypertension without significant proteinuria, unspecified trimester: O13.9

## 2014-04-24 NOTE — Patient Instructions (Signed)
   Your procedure is scheduled on:  Tuesday, May 5  [facility] at: 6 AM Pick up the phone at the desk and dial [phone] and inform us of your arrival.  Please call this number if you have any problems the morning of surgery: [phone]  Remember: Do not eat or drink after midnight: Monday Take these medicines the morning of surgery with a SIP OF WATER:  Do not wear jewelry, make-up, or FINGER nail polish No metal in your hair or on your body. Do not wear lotions, powders, perfumes.  You may wear deodorant.  Do not bring valuables to the hospital. Contacts, dentures or bridgework may not be worn into surgery.  Leave suitcase in the car. After Surgery it may be brought to your room. For patients being admitted to the hospital, checkout time is 11:00am the day of discharge.

## 2014-04-24 NOTE — MAU Note (Addendum)
PT  SAYS  SHE IS SCH FOR REP  C/S ON 5-5-     [facility] HURT BAD AT 9-10 PM.  STARTED   BLEEDING   ON Tuesday-  WENT TO DR ON WED-  VE CLOSED.   TONIGHT MORE  BLOODY  SHOW.      GBS- NEG.   DENIES HSV AND MRSA.

## 2014-04-24 NOTE — Discharge Instructions (Signed)
Braxton Hicks Contractions Pregnancy is commonly associated with contractions of the uterus throughout the pregnancy. Towards the end of pregnancy (32 to 34 weeks), these contractions (Braxton Hicks) can develop more often and may become more forceful. This is not true labor because these contractions do not result in opening (dilatation) and thinning of the cervix. They are sometimes difficult to tell apart from true labor because these contractions can be forceful and people have different pain tolerances. You should not feel embarrassed if you go to the hospital with false labor. Sometimes, the only way to tell if you are in true labor is for your caregiver to follow the changes in the cervix. How to tell the difference between true and false labor:  False labor.  The contractions of false labor are usually shorter, irregular and not as hard as those of true labor.  They are often felt in the front of the lower abdomen and in the groin.  They may leave with walking around or changing positions while lying down.  They get weaker and are shorter lasting as time goes on.  These contractions are usually irregular.  They do not usually become progressively stronger, regular and closer together as with true labor.  True labor.  Contractions in true labor last 30 to 70 seconds, become very regular, usually become more intense, and increase in frequency.  They do not go away with walking.  The discomfort is usually felt in the top of the uterus and spreads to the lower abdomen and low back.  True labor can be determined by your caregiver with an exam. This will show that the cervix is dilating and getting thinner. If there are no prenatal problems or other health problems associated with the pregnancy, it is completely safe to be sent home with false labor and await the onset of true labor. HOME CARE INSTRUCTIONS   Keep up with your usual exercises and instructions.  Take medications as  directed.  Keep your regular prenatal appointment.  Eat and drink lightly if you think you are going into labor.  If BH contractions are making you uncomfortable:  Change your activity position from lying down or resting to walking/walking to resting.  Sit and rest in a tub of warm water.  Drink 2 to 3 glasses of water. Dehydration may cause B-H contractions.  Do slow and deep breathing several times an hour. SEEK IMMEDIATE MEDICAL CARE IF:   Your contractions continue to become stronger, more regular, and closer together.  You have a gushing, burst or leaking of fluid from the vagina.  An oral temperature above 102 F (38.9 C) develops.  You have passage of blood-tinged mucus.  You develop vaginal bleeding.  You develop continuous belly (abdominal) pain.  You have low back pain that you never had before.  You feel the baby's head pushing down causing pelvic pressure.  The baby is not moving as much as it used to. Document Released: 12/11/2005 Document Revised: 03/04/2012 Document Reviewed: 09/22/2013 [employer] Patient Information 2014 [employer].  Fetal Movement Counts Patient Name: __________________________________________________ Patient Due Date: ____________________ Performing a fetal movement count is highly recommended in high-risk pregnancies, but it is good for every pregnant woman to do. Your caregiver may ask you to start counting fetal movements at 28 weeks of the pregnancy. Fetal movements often increase:  After eating a full meal.  After physical activity.  After eating or drinking something sweet or cold.  At rest. Pay attention to when you feel   the baby is most active. This will help you notice a pattern of your baby's sleep and wake cycles and what factors contribute to an increase in fetal movement. It is important to perform a fetal movement count at the same time each day when your baby is normally most active.  HOW TO COUNT FETAL  MOVEMENTS 1. Find a quiet and comfortable area to sit or lie down on your left side. Lying on your left side provides the best blood and oxygen circulation to your baby. 2. Write down the day and time on a sheet of paper or in a journal. 3. Start counting kicks, flutters, swishes, rolls, or jabs in a 2 hour period. You should feel at least 10 movements within 2 hours. 4. If you do not feel 10 movements in 2 hours, wait 2 3 hours and count again. Look for a change in the pattern or not enough counts in 2 hours. SEEK MEDICAL CARE IF:  You feel less than 10 counts in 2 hours, tried twice.  There is no movement in over an hour.  The pattern is changing or taking longer each day to reach 10 counts in 2 hours.  You feel the baby is not moving as he or she usually does. Date: ____________ Movements: ____________ Start time: ____________ Finish time: ____________  Date: ____________ Movements: ____________ Start time: ____________ Finish time: ____________ Date: ____________ Movements: ____________ Start time: ____________ Finish time: ____________ Date: ____________ Movements: ____________ Start time: ____________ Finish time: ____________ Date: ____________ Movements: ____________ Start time: ____________ Finish time: ____________ Date: ____________ Movements: ____________ Start time: ____________ Finish time: ____________ Date: ____________ Movements: ____________ Start time: ____________ Finish time: ____________ Date: ____________ Movements: ____________ Start time: ____________ Finish time: ____________  Date: ____________ Movements: ____________ Start time: ____________ Finish time: ____________ Date: ____________ Movements: ____________ Start time: ____________ Finish time: ____________ Date: ____________ Movements: ____________ Start time: ____________ Finish time: ____________ Date: ____________ Movements: ____________ Start time: ____________ Finish time: ____________ Date: ____________  Movements: ____________ Start time: ____________ Finish time: ____________ Date: ____________ Movements: ____________ Start time: ____________ Finish time: ____________ Date: ____________ Movements: ____________ Start time: ____________ Finish time: ____________  Date: ____________ Movements: ____________ Start time: ____________ Finish time: ____________ Date: ____________ Movements: ____________ Start time: ____________ Finish time: ____________ Date: ____________ Movements: ____________ Start time: ____________ Finish time: ____________ Date: ____________ Movements: ____________ Start time: ____________ Finish time: ____________ Date: ____________ Movements: ____________ Start time: ____________ Finish time: ____________ Date: ____________ Movements: ____________ Start time: ____________ Finish time: ____________ Date: ____________ Movements: ____________ Start time: ____________ Finish time: ____________  Date: ____________ Movements: ____________ Start time: ____________ Finish time: ____________ Date: ____________ Movements: ____________ Start time: ____________ Finish time: ____________ Date: ____________ Movements: ____________ Start time: ____________ Finish time: ____________ Date: ____________ Movements: ____________ Start time: ____________ Finish time: ____________ Date: ____________ Movements: ____________ Start time: ____________ Finish time: ____________ Date: ____________ Movements: ____________ Start time: ____________ Finish time: ____________ Date: ____________ Movements: ____________ Start time: ____________ Finish time: ____________  Date: ____________ Movements: ____________ Start time: ____________ Finish time: ____________ Date: ____________ Movements: ____________ Start time: ____________ Finish time: ____________ Date: ____________ Movements: ____________ Start time: ____________ Finish time: ____________ Date: ____________ Movements: ____________ Start time:  ____________ Finish time: ____________ Date: ____________ Movements: ____________ Start time: ____________ Finish time: ____________ Date: ____________ Movements: ____________ Start time: ____________ Finish time: ____________ Date: ____________ Movements: ____________ Start time: ____________ Finish time: ____________  Date: ____________ Movements: ____________ Start time: ____________ Finish time: ____________ Date: ____________ Movements: ____________ Start   time: ____________ Finish time: ____________ Date: ____________ Movements: ____________ Start time: ____________ Finish time: ____________ Date: ____________ Movements: ____________ Start time: ____________ Finish time: ____________ Date: ____________ Movements: ____________ Start time: ____________ Finish time: ____________ Date: ____________ Movements: ____________ Start time: ____________ Finish time: ____________ Date: ____________ Movements: ____________ Start time: ____________ Finish time: ____________  Date: ____________ Movements: ____________ Start time: ____________ Finish time: ____________ Date: ____________ Movements: ____________ Start time: ____________ Finish time: ____________ Date: ____________ Movements: ____________ Start time: ____________ Finish time: ____________ Date: ____________ Movements: ____________ Start time: ____________ Finish time: ____________ Date: ____________ Movements: ____________ Start time: ____________ Finish time: ____________ Date: ____________ Movements: ____________ Start time: ____________ Finish time: ____________ Date: ____________ Movements: ____________ Start time: ____________ Finish time: ____________  Date: ____________ Movements: ____________ Start time: ____________ Finish time: ____________ Date: ____________ Movements: ____________ Start time: ____________ Finish time: ____________ Date: ____________ Movements: ____________ Start time: ____________ Finish time: ____________ Date:  ____________ Movements: ____________ Start time: ____________ Finish time: ____________ Date: ____________ Movements: ____________ Start time: ____________ Finish time: ____________ Date: ____________ Movements: ____________ Start time: ____________ Finish time: ____________ Document Released: 01/10/2007 Document Revised: 11/27/2012 Document Reviewed: 10/07/2012 [employer] Patient Information 2014 [employer].  

## 2014-04-25 ENCOUNTER — Inpatient Hospital Stay (HOSPITAL_COMMUNITY)
Admission: AD | Admit: 2014-04-25 | Discharge: 2014-04-27 | DRG: 765 | Disposition: A | Discharge status: Home / Self Care | Payer: BC Managed Care – PPO | Source: Ambulatory Visit | Attending: Obstetrics & Gynecology | Admitting: Obstetrics & Gynecology

## 2014-04-25 ENCOUNTER — Inpatient Hospital Stay (HOSPITAL_COMMUNITY): Payer: BC Managed Care – PPO

## 2014-04-25 ENCOUNTER — Encounter (HOSPITAL_COMMUNITY)
Admission: AD | Disposition: A | Discharge status: Home / Self Care | Payer: Self-pay | Source: Ambulatory Visit | Attending: Obstetrics & Gynecology

## 2014-04-25 ENCOUNTER — Encounter (HOSPITAL_COMMUNITY): Payer: BC Managed Care – PPO

## 2014-04-25 ENCOUNTER — Encounter (HOSPITAL_COMMUNITY): Payer: Self-pay | Admitting: Family

## 2014-04-25 DIAGNOSIS — O1002 Pre-existing essential hypertension complicating childbirth: Secondary | ICD-10-CM | POA: Diagnosis present

## 2014-04-25 DIAGNOSIS — Z98891 History of uterine scar from previous surgery: Secondary | ICD-10-CM

## 2014-04-25 DIAGNOSIS — O34219 Maternal care for unspecified type scar from previous cesarean delivery: Principal | ICD-10-CM | POA: Diagnosis present

## 2014-04-25 DIAGNOSIS — Z833 Family history of diabetes mellitus: Secondary | ICD-10-CM

## 2014-04-25 LAB — CBC
HCT: 38.7 % (ref 36.0–46.0)
Hemoglobin: 13.2 g/dL (ref 12.0–15.0)
MCH: 30.7 pg (ref 26.0–34.0)
MCHC: 34.1 g/dL (ref 30.0–36.0)
MCV: 90 fL (ref 78.0–100.0)
PLATELETS: 231 10*3/uL (ref 150–400)
RBC: 4.3 MIL/uL (ref 3.87–5.11)
RDW: 14 % (ref 11.5–15.5)
WBC: 14.4 10*3/uL — AB (ref 4.0–10.5)

## 2014-04-25 SURGERY — Surgical Case
Anesthesia: Spinal

## 2014-04-25 SURGERY — Surgical Case
Anesthesia: Regional

## 2014-04-25 MED ORDER — ONDANSETRON HCL 4 MG/2ML IJ SOLN
INTRAMUSCULAR | Status: AC
  Filled 2014-04-25: qty 2

## 2014-04-25 MED ORDER — MEPERIDINE HCL 25 MG/ML IJ SOLN: 6.2500 mg | INTRAMUSCULAR | Status: DC | PRN

## 2014-04-25 MED ORDER — METOCLOPRAMIDE HCL 5 MG/ML IJ SOLN: 10.0000 mg | Freq: Three times a day (TID) | INTRAMUSCULAR | Status: DC | PRN

## 2014-04-25 MED ORDER — MENTHOL 3 MG MT LOZG: 1.0000 | LOZENGE | OROMUCOSAL | Status: DC | PRN

## 2014-04-25 MED ORDER — FAMOTIDINE IN NACL 20-0.9 MG/50ML-% IV SOLN
20.0000 mg | Freq: Once | INTRAVENOUS | Status: AC
  Administered 2014-04-25: 20 mg via INTRAVENOUS
  Filled 2014-04-25: qty 50

## 2014-04-25 MED ORDER — NALOXONE HCL 0.4 MG/ML IJ SOLN: 0.4000 mg | INTRAMUSCULAR | Status: DC | PRN

## 2014-04-25 MED ORDER — SENNOSIDES-DOCUSATE SODIUM 8.6-50 MG PO TABS
2.0000 | ORAL_TABLET | ORAL | Status: DC
  Administered 2014-04-25 – 2014-04-26 (×2): 2 via ORAL
  Filled 2014-04-25 (×2): qty 2

## 2014-04-25 MED ORDER — ONDANSETRON HCL 4 MG/2ML IJ SOLN: 4.0000 mg | INTRAMUSCULAR | Status: DC | PRN

## 2014-04-25 MED ORDER — DIPHENHYDRAMINE HCL 50 MG/ML IJ SOLN
12.5000 mg | INTRAMUSCULAR | Status: DC | PRN
Start: 2014-04-25 — End: 2014-04-27

## 2014-04-25 MED ORDER — NALBUPHINE HCL 10 MG/ML IJ SOLN: 5.0000 mg | INTRAMUSCULAR | Status: DC | PRN

## 2014-04-25 MED ORDER — TETANUS-DIPHTH-ACELL PERTUSSIS 5-2.5-18.5 LF-MCG/0.5 IM SUSP: 0.5000 mL | Freq: Once | INTRAMUSCULAR | Status: DC

## 2014-04-25 MED ORDER — OXYTOCIN 40 UNITS IN LACTATED RINGERS INFUSION - SIMPLE MED: 62.5000 mL/h | INTRAVENOUS | Status: AC

## 2014-04-25 MED ORDER — SIMETHICONE 80 MG PO CHEW
80.0000 mg | CHEWABLE_TABLET | ORAL | Status: DC
  Administered 2014-04-25 – 2014-04-26 (×2): 80 mg via ORAL
  Filled 2014-04-25 (×2): qty 1

## 2014-04-25 MED ORDER — DIPHENHYDRAMINE HCL 25 MG PO CAPS: 25.0000 mg | ORAL_CAPSULE | Freq: Four times a day (QID) | ORAL | Status: DC | PRN

## 2014-04-25 MED ORDER — ZOLPIDEM TARTRATE 5 MG PO TABS
5.0000 mg | ORAL_TABLET | Freq: Every evening | ORAL | Status: DC | PRN
Start: 2014-04-25 — End: 2014-04-27

## 2014-04-25 MED ORDER — SCOPOLAMINE 1 MG/3DAYS TD PT72
MEDICATED_PATCH | TRANSDERMAL | Status: AC
  Administered 2014-04-25: 1.5 mg via TRANSDERMAL
  Filled 2014-04-25: qty 1

## 2014-04-25 MED ORDER — ONDANSETRON HCL 4 MG PO TABS: 4.0000 mg | ORAL_TABLET | ORAL | Status: DC | PRN

## 2014-04-25 MED ORDER — ONDANSETRON HCL 4 MG/2ML IJ SOLN: 4.0000 mg | Freq: Three times a day (TID) | INTRAMUSCULAR | Status: DC | PRN

## 2014-04-25 MED ORDER — LACTATED RINGERS IV SOLN
INTRAVENOUS | Status: DC
  Administered 2014-04-25 (×3): via INTRAVENOUS

## 2014-04-25 MED ORDER — PRENATAL MULTIVITAMIN CH
1.0000 | ORAL_TABLET | Freq: Every day | ORAL | Status: DC
  Administered 2014-04-26: 1 via ORAL
  Filled 2014-04-25: qty 1

## 2014-04-25 MED ORDER — SODIUM CHLORIDE 0.9 % IJ SOLN: 3.0000 mL | INTRAMUSCULAR | Status: DC | PRN

## 2014-04-25 MED ORDER — DEXTROSE IN LACTATED RINGERS 5 % IV SOLN: INTRAVENOUS | Status: DC

## 2014-04-25 MED ORDER — DIBUCAINE 1 % RE OINT: 1.0000 "application " | TOPICAL_OINTMENT | RECTAL | Status: DC | PRN

## 2014-04-25 MED ORDER — CEFAZOLIN SODIUM-DEXTROSE 2-3 GM-% IV SOLR
2.0000 g | INTRAVENOUS | Status: AC
  Administered 2014-04-25: 2 g via INTRAVENOUS
  Filled 2014-04-25: qty 50

## 2014-04-25 MED ORDER — PHENYLEPHRINE 8 MG IN D5W 100 ML (0.08MG/ML) PREMIX OPTIME
INJECTION | INTRAVENOUS | Status: DC | PRN
  Administered 2014-04-25: 60 ug/min via INTRAVENOUS

## 2014-04-25 MED ORDER — DIPHENHYDRAMINE HCL 50 MG/ML IJ SOLN: 25.0000 mg | INTRAMUSCULAR | Status: DC | PRN

## 2014-04-25 MED ORDER — FENTANYL CITRATE 0.05 MG/ML IJ SOLN
INTRAMUSCULAR | Status: DC | PRN
  Administered 2014-04-25: 25 ug via INTRATHECAL

## 2014-04-25 MED ORDER — SIMETHICONE 80 MG PO CHEW
80.0000 mg | CHEWABLE_TABLET | Freq: Three times a day (TID) | ORAL | Status: DC
  Administered 2014-04-25 – 2014-04-26 (×3): 80 mg via ORAL
  Filled 2014-04-25 (×4): qty 1

## 2014-04-25 MED ORDER — LANOLIN HYDROUS EX OINT: 1.0000 "application " | TOPICAL_OINTMENT | CUTANEOUS | Status: DC | PRN

## 2014-04-25 MED ORDER — IBUPROFEN 600 MG PO TABS
600.0000 mg | ORAL_TABLET | Freq: Four times a day (QID) | ORAL | Status: DC
  Administered 2014-04-25 – 2014-04-27 (×7): 600 mg via ORAL
  Filled 2014-04-25 (×7): qty 1

## 2014-04-25 MED ORDER — SCOPOLAMINE 1 MG/3DAYS TD PT72
1.0000 | MEDICATED_PATCH | Freq: Once | TRANSDERMAL | Status: AC
  Administered 2014-04-25: 1.5 mg via TRANSDERMAL

## 2014-04-25 MED ORDER — OXYTOCIN 10 UNIT/ML IJ SOLN
40.0000 [IU] | INTRAVENOUS | Status: DC | PRN
  Administered 2014-04-25: 40 [IU] via INTRAVENOUS

## 2014-04-25 MED ORDER — ONDANSETRON HCL 4 MG/2ML IJ SOLN
INTRAMUSCULAR | Status: DC | PRN
  Administered 2014-04-25: 4 mg via INTRAVENOUS

## 2014-04-25 MED ORDER — OXYTOCIN 10 UNIT/ML IJ SOLN
INTRAMUSCULAR | Status: AC
  Filled 2014-04-25: qty 4

## 2014-04-25 MED ORDER — KETOROLAC TROMETHAMINE 30 MG/ML IJ SOLN: 30.0000 mg | Freq: Four times a day (QID) | INTRAMUSCULAR | Status: AC | PRN

## 2014-04-25 MED ORDER — PHENYLEPHRINE 8 MG IN D5W 100 ML (0.08MG/ML) PREMIX OPTIME
INJECTION | INTRAVENOUS | Status: AC
  Filled 2014-04-25: qty 100

## 2014-04-25 MED ORDER — HYDROMORPHONE HCL PF 1 MG/ML IJ SOLN: 0.2500 mg | INTRAMUSCULAR | Status: DC | PRN

## 2014-04-25 MED ORDER — NALOXONE HCL 1 MG/ML IJ SOLN
1.0000 ug/kg/h | INTRAMUSCULAR | Status: DC | PRN
  Filled 2014-04-25: qty 2

## 2014-04-25 MED ORDER — LACTATED RINGERS IV SOLN: INTRAVENOUS | Status: DC

## 2014-04-25 MED ORDER — CITRIC ACID-SODIUM CITRATE 334-500 MG/5ML PO SOLN
30.0000 mL | Freq: Once | ORAL | Status: AC
  Administered 2014-04-25: 30 mL via ORAL
  Filled 2014-04-25: qty 15

## 2014-04-25 MED ORDER — SIMETHICONE 80 MG PO CHEW: 80.0000 mg | CHEWABLE_TABLET | ORAL | Status: DC | PRN

## 2014-04-25 MED ORDER — OXYCODONE-ACETAMINOPHEN 5-325 MG PO TABS: 1.0000 | ORAL_TABLET | ORAL | Status: DC | PRN

## 2014-04-25 MED ORDER — DIPHENHYDRAMINE HCL 25 MG PO CAPS
25.0000 mg | ORAL_CAPSULE | ORAL | Status: DC | PRN
Start: 2014-04-25 — End: 2014-04-27

## 2014-04-25 MED ORDER — LACTATED RINGERS IV SOLN
INTRAVENOUS | Status: DC | PRN
  Administered 2014-04-25: 12:00:00 via INTRAVENOUS

## 2014-04-25 MED ORDER — WITCH HAZEL-GLYCERIN EX PADS: 1.0000 "application " | MEDICATED_PAD | CUTANEOUS | Status: DC | PRN

## 2014-04-25 MED ORDER — MORPHINE SULFATE (PF) 0.5 MG/ML IJ SOLN
INTRAMUSCULAR | Status: DC | PRN
  Administered 2014-04-25: .15 mg via EPIDURAL

## 2014-04-25 SURGICAL SUPPLY — 37 items
ADH SKN CLS APL DERMABOND .7 (GAUZE/BANDAGES/DRESSINGS)
APL SKNCLS STERI-STRIP NONHPOA (GAUZE/BANDAGES/DRESSINGS) ×1
BENZOIN TINCTURE PRP APPL 2/3 (GAUZE/BANDAGES/DRESSINGS) ×2 IMPLANT
CLAMP CORD UMBIL (MISCELLANEOUS) IMPLANT
CLOSURE WOUND 1/2 X4 (GAUZE/BANDAGES/DRESSINGS) ×2
CLOTH BEACON ORANGE TIMEOUT ST (SAFETY) ×3 IMPLANT
DERMABOND ADVANCED (GAUZE/BANDAGES/DRESSINGS)
DERMABOND ADVANCED .7 DNX12 (GAUZE/BANDAGES/DRESSINGS) IMPLANT
DRAPE LG THREE QUARTER DISP (DRAPES) IMPLANT
DRSG OPSITE POSTOP 4X10 (GAUZE/BANDAGES/DRESSINGS) ×3 IMPLANT
DURAPREP 26ML APPLICATOR (WOUND CARE) ×3 IMPLANT
ELECT REM PT RETURN 9FT ADLT (ELECTROSURGICAL) ×3
ELECTRODE REM PT RTRN 9FT ADLT (ELECTROSURGICAL) ×1 IMPLANT
EXTRACTOR VACUUM KIWI (MISCELLANEOUS) IMPLANT
EXTRACTOR VACUUM M CUP 4 TUBE (SUCTIONS) IMPLANT
EXTRACTOR VACUUM M CUP 4' TUBE (SUCTIONS)
GLOVE BIO SURGEON STRL SZ 6 (GLOVE) ×3 IMPLANT
GLOVE BIOGEL PI IND STRL 6 (GLOVE) ×2 IMPLANT
GLOVE BIOGEL PI INDICATOR 6 (GLOVE) ×4
GOWN STRL REUS W/TWL LRG LVL3 (GOWN DISPOSABLE) ×6 IMPLANT
KIT ABG SYR 3ML LUER SLIP (SYRINGE) ×3 IMPLANT
NDL HYPO 25X5/8 SAFETYGLIDE (NEEDLE) ×1 IMPLANT
NEEDLE HYPO 25X5/8 SAFETYGLIDE (NEEDLE) ×3 IMPLANT
NS IRRIG 1000ML POUR BTL (IV SOLUTION) ×3 IMPLANT
PACK C SECTION WH (CUSTOM PROCEDURE TRAY) ×3 IMPLANT
PAD OB MATERNITY 4.3X12.25 (PERSONAL CARE ITEMS) ×3 IMPLANT
STAPLER VISISTAT 35W (STAPLE) IMPLANT
STRIP CLOSURE SKIN 1/2X4 (GAUZE/BANDAGES/DRESSINGS) ×2 IMPLANT
SUT CHROMIC 0 CTX 36 (SUTURE) ×13 IMPLANT
SUT MON AB 2-0 CT1 27 (SUTURE) ×3 IMPLANT
SUT PDS AB 0 CT1 27 (SUTURE) ×2 IMPLANT
SUT PLAIN 0 NONE (SUTURE) IMPLANT
SUT VIC AB 0 CT1 36 (SUTURE) IMPLANT
SUT VIC AB 4-0 KS 27 (SUTURE) ×2 IMPLANT
TOWEL OR 17X24 6PK STRL BLUE (TOWEL DISPOSABLE) ×3 IMPLANT
TRAY FOLEY CATH 14FR (SET/KITS/TRAYS/PACK) IMPLANT
WATER STERILE IRR 1000ML POUR (IV SOLUTION) ×3 IMPLANT

## 2014-04-25 NOTE — Op Note (Signed)
Jade GraffSarah E Lowe PROCEDURE DATE: 04/25/2014  PREOPERATIVE DIAGNOSIS: Intrauterine pregnancy at  3333w5d weeks gestation with labor and previous C/S  POSTOPERATIVE DIAGNOSIS: The same  PROCEDURE:   Repeat Low Transverse Cesarean Section  SURGEON:  Dr. Mitchel HonourMegan Shelanda Duvall  INDICATIONS: Jade GraffSarah E Lowe is a 33 y.o. G3P1011 at 6033w5d scheduled for cesarean section secondary to labor and previous C/S with desire for repeat.  The risks of cesarean section discussed with the patient included but were not limited to: bleeding which may require transfusion or reoperation; infection which may require antibiotics; injury to bowel, bladder, ureters or other surrounding organs; injury to the fetus; need for additional procedures including hysterectomy in the event of a life-threatening hemorrhage; placental abnormalities wth subsequent pregnancies, incisional problems, thromboembolic phenomenon and other postoperative/anesthesia complications. The patient concurred with the proposed plan, giving informed written consent for the procedure.    FINDINGS:  Viable female infant in cephalic presentation, APGARs 9,9:  Weight pending  clear amniotic fluid.  Intact placenta, three vessel cord.  Grossly normal uterus, ovaries and fallopian tubes. .   ANESTHESIA:    Spinal ESTIMATED BLOOD LOSS: 750 ml SPECIMENS: Placenta sent to L&D COMPLICATIONS: None immediate  PROCEDURE IN DETAIL:  The patient received intravenous antibiotics and had sequential compression devices applied to her lower extremities while in the preoperative area.  She was then taken to the operating room where spinal anesthesia was administered and was found to be adequate. She was then placed in a dorsal supine position with a leftward tilt, and prepped and draped in a sterile manner.  A foley catheter was placed into her bladder and attached to constant gravity.  After an adequate timeout was performed, a Pfannenstiel skin incision was made with scalpel and  carried through to the underlying layer of fascia. The fascia was incised in the midline and this incision was extended bilaterally using the Mayo scissors. Kocher clamps were applied to the superior aspect of the fascial incision and the underlying rectus muscles were dissected off bluntly. A similar process was carried out on the inferior aspect of the facial incision. The rectus muscles were separated in the midline bluntly and the peritoneum was entered bluntly.   A bladder flap was created sharply and developed bluntly.  A transverse hysterotomy was made with a scalpel and extended bilaterally bluntly. The bladder blade was then removed. The infant was successfully delivered, and cord was clamped and cut and infant was handed over to awaiting neonatology team. Uterine massage was then administered and the placenta delivered intact with three-vessel cord. The uterus was cleared of clot and debris.  The hysterotomy was closed with #1 Chromic.  A second imbricating suture of #1 Chromic was used to reinforce the incision and aid in hemostasis.  The peritoneum and rectus muscles were noted to be hemostatic and were reapproximated using 2-0 Monocryl.  The fascia was closed with PDS in a running fashion with good restoration of anatomy.  The subcutaneus tissue was copiously irrigated.  The skin was closed with 4-0 Vicryl.  Pt tolerated the procedure will.  All counts were correct x2.  Pt went to the recovery room in stable condition.

## 2014-04-25 NOTE — MAU Note (Signed)
Pt states here for labor eval. Denies bleeding, and no large gush of fluid, however does note small trickle of fluid with contractions. Contractions q5 minutes apart.

## 2014-04-25 NOTE — Progress Notes (Signed)
Dr Langston MaskerMorris notified of pt's VE, FHR pattern, contraction pattern and desire for repeat C/S. Orders received.

## 2014-04-25 NOTE — MAU Note (Signed)
33 yo, G3P1 at 4732w5d, presents to MAU with c/o contractions since 2200 yesterday. She is a repeat c/s. Reports +FM. Denies VB.

## 2014-04-25 NOTE — Transfer of Care (Signed)
Immediate Anesthesia Transfer of Care Note  Patient: Jade GraffSarah E Lowe  Procedure(s) Performed: Procedure(s): CESAREAN SECTION (N/A)  Patient Location: PACU  Anesthesia Type:Spinal  Level of Consciousness: awake, alert  and oriented  Airway & Oxygen Therapy: Patient Spontanous Breathing  Post-op Assessment: Report given to PACU RN  Post vital signs: Reviewed and stable  Complications: No apparent anesthesia complications

## 2014-04-25 NOTE — Anesthesia Preprocedure Evaluation (Signed)
Anesthesia Evaluation  Patient identified by MRN, date of birth, ID band Patient awake    Reviewed: Allergy & Precautions, H&P , Patient's Chart, lab work & pertinent test results  Airway Mallampati: II TM Distance: >3 FB Neck ROM: full    Dental no notable dental hx.    Pulmonary  breath sounds clear to auscultation  Pulmonary exam normal       Cardiovascular Exercise Tolerance: Good hypertension, Rhythm:regular Rate:Normal     Neuro/Psych    GI/Hepatic   Endo/Other    Renal/GU      Musculoskeletal   Abdominal   Peds  Hematology   Anesthesia Other Findings   Reproductive/Obstetrics                           Anesthesia Physical Anesthesia Plan  ASA: II and emergent  Anesthesia Plan: Spinal   Post-op Pain Management:    Induction:   Airway Management Planned:   Additional Equipment:   Intra-op Plan:   Post-operative Plan:   Informed Consent: I have reviewed the patients History and Physical, chart, labs and discussed the procedure including the risks, benefits and alternatives for the proposed anesthesia with the patient or authorized representative who has indicated his/her understanding and acceptance.   Dental Advisory Given  Plan Discussed with: CRNA  Anesthesia Plan Comments: (Lab work confirmed with CRNA in room. Platelets okay. Discussed spinal anesthetic, and patient consents to the procedure:  included risk of possible headache,backache, failed block, allergic reaction, and nerve injury. This patient was asked if she had any questions or concerns before the procedure started. )        Anesthesia Quick Evaluation

## 2014-04-25 NOTE — Anesthesia Postprocedure Evaluation (Signed)
  Anesthesia Post-op Note  Patient: Jade GraffSarah E Prabhu  Procedure(s) Performed: Procedure(s): CESAREAN SECTION (N/A)  Last Vitals:  Filed Vitals:   04/25/14 1451  BP: 121/79  Pulse: 82  Temp: 36.9 C  Resp: 17     Patient is awake, responsive, moving her legs, and has signs of resolution of her numbness. Pain and nausea are reasonably well controlled. Vital signs are stable and clinically acceptable. Oxygen saturation is clinically acceptable. There are no apparent anesthetic complications at this time. Patient is ready for discharge.

## 2014-04-25 NOTE — H&P (Signed)
Jade GraffSarah E Lowe is a 33 y.o. female presenting for labor.  CTX began last night and have become increasingly painful.  No VB or LOF.  +FM.  Patient has h/o Pre-eclampsia; no problems this pregnancy.  She had previous C/S and desires repeat.  GBS negative.  Last po 8am.    Maternal Medical History:  Reason for admission: Contractions.   Contractions: Onset was 6-12 hours ago.   Frequency: regular.   Perceived severity is moderate.    Fetal activity: Perceived fetal activity is normal.   Last perceived fetal movement was within the past hour.    Prenatal complications: no prenatal complications Prenatal Complications - Diabetes: none.    OB History   Grav Para Term Preterm Abortions TAB SAB Ect Mult Living   3 1 1  1   1  1      Past Medical History  Diagnosis Date  . Pregnancy induced hypertension    Past Surgical History  Procedure Laterality Date  . Cesarean section  04/2010    [facility]   Family History: family history includes Cancer in her paternal aunt; Diabetes in her mother. Social History:  reports that she has never smoked. She has never used smokeless tobacco. She reports that she does not drink alcohol or use illicit drugs.   Prenatal Transfer Tool  Maternal Diabetes: No Genetic Screening: Normal Maternal Ultrasounds/Referrals: Normal Fetal Ultrasounds or other Referrals:  None Maternal Substance Abuse:  No Significant Maternal Medications:  None Significant Maternal Lab Results:  Lab values include: Group B Strep negative Other Comments:  None  ROS  Dilation: 3 Effacement (%): 100 Station: -1 Exam by:: Jolaine ArtistG. Keyanah Kozicki, RN  Blood pressure 125/87, pulse 102, temperature 97.5 F (36.4 C), temperature source Oral, resp. rate 15, height 4\' 11"  (1.499 m), weight 147 lb 2 oz (66.735 kg). Maternal Exam:  Uterine Assessment: Contraction strength is moderate.  Contraction frequency is regular.   Abdomen: Patient reports no abdominal tenderness. Surgical scars: low  transverse.   Fundal height is c/w dates.   Estimated fetal weight is 7#4.       Physical Exam  Constitutional: She is oriented to person, place, and time. She appears well-developed and well-nourished.  GI: Soft. There is no rebound and no guarding.  Neurological: She is alert and oriented to person, place, and time.  Skin: Skin is warm and dry.  Psychiatric: She has a normal mood and affect. Her behavior is normal.    Prenatal labs: ABO, Rh: O/Positive/-- (10/02 0000) Antibody: Negative (10/02 0000) Rubella: Immune (10/02 0000) RPR: Nonreactive (10/02 0000)  HBsAg: Negative (10/02 0000)  HIV: Non-reactive (10/02 0000)  GBS: Negative (04/15 0000)   Assessment/Plan: 33yo G3P1011 at 5865w5d with labor, previous C/S and desire for repeat -Patient offered TOL but declines -Will attempt to wait for po status but will go to OR if labor progresses -Patient counseled re: risk of bleeding, infection, scarring and damage to surrounding structures.  All questions were answered and the patient wishes to proceed.        Mitchel HonourMegan Jamicheal Heard 04/25/2014, 10:55 AM

## 2014-04-25 NOTE — Anesthesia Procedure Notes (Signed)

## 2014-04-26 LAB — CBC
HCT: 29.6 % — ABNORMAL LOW (ref 36.0–46.0)
HEMOGLOBIN: 10 g/dL — AB (ref 12.0–15.0)
MCH: 30.5 pg (ref 26.0–34.0)
MCHC: 33.4 g/dL (ref 30.0–36.0)
MCV: 91.1 fL (ref 78.0–100.0)
Platelets: 178 10*3/uL (ref 150–400)
RBC: 3.25 MIL/uL — ABNORMAL LOW (ref 3.87–5.11)
RDW: 14.5 % (ref 11.5–15.5)
WBC: 14.4 10*3/uL — ABNORMAL HIGH (ref 4.0–10.5)

## 2014-04-26 NOTE — Addendum Note (Signed)
Addendum created 04/26/14 0827 by Lincoln BrighamAngela Draughon Pria Klosinski, CRNA   Modules edited: Notes Section   Notes Section:  File: [id number]

## 2014-04-26 NOTE — Anesthesia Postprocedure Evaluation (Signed)
  Anesthesia Post-op Note  Patient: Jade GraffSarah E Lowe  Procedure(s) Performed: Procedure(s): CESAREAN SECTION (N/A)  Patient Location: Mother/Baby  Anesthesia Type:Spinal  Level of Consciousness: awake, alert  and oriented  Airway and Oxygen Therapy: Patient Spontanous Breathing  Post-op Pain: mild  Post-op Assessment: Patient's Cardiovascular Status Stable, Respiratory Function Stable, Patent Airway, No signs of Nausea or vomiting, Pain level controlled, No headache, No backache, No residual numbness and No residual motor weakness  Post-op Vital Signs: stable  Last Vitals:  Filed Vitals:   04/26/14 0500  BP: 104/68  Pulse: 96  Temp: 37.1 C  Resp: 20    Complications: No apparent anesthesia complications

## 2014-04-26 NOTE — Progress Notes (Signed)
Subjective: Postpartum Day 1: Cesarean Delivery Patient reports tolerating PO and no problems voiding.    Objective: Vital signs in last 24 hours: Temp:  [97.3 F (36.3 C)-98.9 F (37.2 C)] 98.8 F (37.1 C) (05/03 0500) Pulse Rate:  [75-106] 96 (05/03 0500) Resp:  [14-20] 20 (05/03 0500) BP: (93-125)/(58-96) 104/68 mmHg (05/03 0500) SpO2:  [96 %-98 %] 98 % (05/03 0500) Weight:  [147 lb 2 oz (66.735 kg)] 147 lb 2 oz (66.735 kg) (05/02 1007)  Physical Exam:  General: alert, cooperative and appears stated age Lochia: appropriate Uterine Fundus: firm Incision: healing well, no significant drainage, no dehiscence DVT Evaluation: No evidence of DVT seen on physical exam. Negative Homan's sign. No cords or calf tenderness.   Recent Labs  04/25/14 1025 04/26/14 0605  HGB 13.2 10.0*  HCT 38.7 29.6*    Assessment/Plan: Status post Cesarean section. Doing well postoperatively.  Continue current care.  Mitchel HonourMegan Niasia Lanphear 04/26/2014, 8:29 AM

## 2014-04-27 ENCOUNTER — Encounter (HOSPITAL_COMMUNITY): Payer: Self-pay | Admitting: Obstetrics & Gynecology

## 2014-04-27 ENCOUNTER — Inpatient Hospital Stay (HOSPITAL_COMMUNITY)
Admission: RE | Admit: 2014-04-27 | Discharge: 2014-04-27 | Disposition: A | Discharge status: Home / Self Care | Payer: BC Managed Care – PPO | Source: Ambulatory Visit

## 2014-04-27 MED ORDER — HYDROCODONE-ACETAMINOPHEN 5-325 MG PO TABS: 1.0000 | ORAL_TABLET | ORAL | Status: DC | PRN

## 2014-04-27 MED ORDER — IBUPROFEN 600 MG PO TABS: 600.0000 mg | ORAL_TABLET | Freq: Four times a day (QID) | ORAL | Status: DC

## 2014-04-27 NOTE — Discharge Summary (Signed)
Obstetric Discharge Summary Reason for Admission: cesarean section Prenatal Procedures: ultrasound Intrapartum Procedures: cesarean: low cervical, transverse Postpartum Procedures: none Complications-Operative and Postpartum: none Hemoglobin  Date Value Ref Range Status  04/26/2014 10.0* 12.0 - 15.0 g/dL Final     REPEATED TO VERIFY     DELTA CHECK NOTED     HCT  Date Value Ref Range Status  04/26/2014 29.6* 36.0 - 46.0 % Final    Physical Exam:  General: alert and cooperative Lochia: appropriate Uterine Fundus: firm Incision: healing well DVT Evaluation: No evidence of DVT seen on physical exam. Negative Homan's sign. No cords or calf tenderness. No significant calf/ankle edema.  Discharge Diagnoses: Term Pregnancy-delivered  Discharge Information: Date: 04/27/2014 Activity: pelvic rest Diet: routine Medications: PNV, Ibuprofen and Vicodin Condition: stable Instructions: refer to practice specific booklet Discharge to: home   Newborn Data: Live born female  Birth Weight: 5 lb 13.3 oz (2645 g) APGAR: 9, 9  Home with mother.  Judith BlonderCarol G Curtis 04/27/2014, 8:18 AM

## 2014-04-28 ENCOUNTER — Inpatient Hospital Stay (HOSPITAL_COMMUNITY)
Admission: RE | Admit: 2014-04-28 | Payer: BC Managed Care – PPO | Source: Ambulatory Visit | Admitting: Obstetrics and Gynecology

## 2014-04-28 ENCOUNTER — Encounter (HOSPITAL_COMMUNITY): Admission: RE | Payer: Self-pay | Source: Ambulatory Visit

## 2014-04-28 SURGERY — Surgical Case
Anesthesia: Regional

## 2014-04-29 ENCOUNTER — Encounter (HOSPITAL_COMMUNITY): Payer: Self-pay | Admitting: *Deleted

## 2014-10-26 ENCOUNTER — Encounter (HOSPITAL_COMMUNITY): Payer: Self-pay | Admitting: *Deleted

## 2015-07-22 ENCOUNTER — Other Ambulatory Visit: Payer: Self-pay | Admitting: Obstetrics and Gynecology

## 2015-07-23 LAB — CYTOLOGY - PAP

## 2016-12-07 ENCOUNTER — Ambulatory Visit (INDEPENDENT_AMBULATORY_CARE_PROVIDER_SITE_OTHER): Payer: BLUE CROSS/BLUE SHIELD | Admitting: Family Medicine

## 2016-12-07 ENCOUNTER — Encounter: Payer: Self-pay | Admitting: Family Medicine

## 2016-12-07 DIAGNOSIS — R05 Cough: Secondary | ICD-10-CM | POA: Diagnosis not present

## 2016-12-07 DIAGNOSIS — R053 Chronic cough: Secondary | ICD-10-CM | POA: Insufficient documentation

## 2016-12-07 MED ORDER — PREDNISONE 20 MG PO TABS: 20.0000 mg | ORAL_TABLET | Freq: Every day | ORAL | 0 refills | Status: DC

## 2016-12-07 MED ORDER — ALBUTEROL SULFATE HFA 108 (90 BASE) MCG/ACT IN AERS: 2.0000 | INHALATION_SPRAY | Freq: Four times a day (QID) | RESPIRATORY_TRACT | 0 refills | Status: DC | PRN

## 2016-12-07 NOTE — Patient Instructions (Addendum)
Start albuterol inhaler as needed every 4-6 hours for coughing fits. Start prednisone taper x 6 days. Trial of zyrtec or xyzal at night for possible allergy component.  Call if not improving in 2 weeks.. Sooner if new fever or shortness of breath.

## 2016-12-07 NOTE — Progress Notes (Signed)
   Subjective:    Patient ID: Jade Lowe, female    DOB: 12/29/1980, 35 y.o.   MRN: [id number]  Cough  This is a new problem. The current episode started 1 to 4 weeks ago (4 weeks). The problem has been gradually improving. The cough is non-productive (dry hacky cough). Pertinent negatives include no chills, ear congestion, ear pain, fever, nasal congestion, postnasal drip, sore throat, shortness of breath or wheezing. Associated symptoms comments: Initially sinus congestion and pressure.. Now resolved. The symptoms are aggravated by lying down. Treatments tried: nyquil , dayquil, delsym. The treatment provided mild relief. Her past medical history is significant for environmental allergies. There is no history of asthma or COPD.   No GERD.   Review of Systems  Constitutional: Negative for chills and fever.  HENT: Negative for ear pain, postnasal drip and sore throat.   Respiratory: Positive for cough. Negative for shortness of breath and wheezing.   Allergic/Immunologic: Positive for environmental allergies.       Objective:   Physical Exam  Constitutional: Vital signs are normal. She appears well-developed and well-nourished. She is cooperative.  Non-toxic appearance. She does not appear ill. No distress.  HENT:  Head: Normocephalic.  Right Ear: Hearing, tympanic membrane, external ear and ear canal normal. Tympanic membrane is not erythematous, not retracted and not bulging.  Left Ear: Hearing, tympanic membrane, external ear and ear canal normal. Tympanic membrane is not erythematous, not retracted and not bulging.  Nose: Mucosal edema and rhinorrhea present. Right sinus exhibits no maxillary sinus tenderness and no frontal sinus tenderness. Left sinus exhibits no maxillary sinus tenderness and no frontal sinus tenderness.  Mouth/Throat: Uvula is midline, oropharynx is clear and moist and mucous membranes are normal.  Eyes: Conjunctivae, EOM and lids are normal. Pupils are equal,  round, and reactive to light. Lids are everted and swept, no foreign bodies found.  Neck: Trachea normal and normal range of motion. Neck supple. Carotid bruit is not present. No thyroid mass and no thyromegaly present.  Cardiovascular: Normal rate, regular rhythm, S1 normal, S2 normal, normal heart sounds, intact distal pulses and normal pulses.  Exam reveals no gallop and no friction rub.   No murmur heard. Pulmonary/Chest: Effort normal and breath sounds normal. No tachypnea. No respiratory distress. She has no decreased breath sounds. She has no wheezes. She has no rhonchi. She has no rales.  Neurological: She is alert.  Skin: Skin is warm, dry and intact. No rash noted.  Psychiatric: Her speech is normal and behavior is normal. Judgment normal. Her mood appears not anxious. Cognition and memory are normal. She does not exhibit a depressed mood.          Assessment & Plan:

## 2017-02-14 NOTE — Assessment & Plan Note (Signed)
Most likely due to post infectious/inflammatory bronchospasm. Start albuterol inhaler as needed every 4-6 hours for coughing fits. Start prednisone taper x 6 days. Trial of zyrtec or xyzal at night for possible allergy component.  Call if not improving in 2 weeks.. Sooner if new fever or shortness of breath.

## 2017-12-31 ENCOUNTER — Ambulatory Visit: Payer: BLUE CROSS/BLUE SHIELD | Admitting: Internal Medicine

## 2017-12-31 ENCOUNTER — Encounter: Payer: Self-pay | Admitting: Internal Medicine

## 2017-12-31 VITALS — BP 108/70 | HR 80 | Temp 98.0°F | Wt 123.5 lb

## 2017-12-31 DIAGNOSIS — H9202 Otalgia, left ear: Secondary | ICD-10-CM | POA: Diagnosis not present

## 2017-12-31 DIAGNOSIS — H6983 Other specified disorders of Eustachian tube, bilateral: Secondary | ICD-10-CM | POA: Diagnosis not present

## 2017-12-31 DIAGNOSIS — H6120 Impacted cerumen, unspecified ear: Secondary | ICD-10-CM

## 2017-12-31 NOTE — Progress Notes (Signed)
Subjective:    Patient ID: Jade Lowe, female    DOB: 01/08/1981, 37 y.o.   MRN: [id number]  HPI  Pt presents to the clinic today with c/o left ear pain. She reports this started yesterday. She describes the pain as sharp at times, achy at other times. She feels like her hearing is muffled. She denies runny nose, nasal congestion or sore throat. She has tried Ibuprofen with some relief. She denies any trauma to the area. She has not had sick contacts.  Review of Systems      Past Medical History:  Diagnosis Date  . Pregnancy induced hypertension     Current Outpatient Medications  Medication Sig Dispense Refill  . LAYOLIS FE 0.8-25 MG-MCG tablet Chew 1 tablet by mouth daily.     Marland Kitchen albuterol (PROVENTIL HFA;VENTOLIN HFA) 108 (90 Base) MCG/ACT inhaler Inhale 2 puffs into the lungs every 6 (six) hours as needed for wheezing or shortness of breath. (Patient not taking: Reported on 12/31/2017) 1 Inhaler 0   No current facility-administered medications for this visit.     No Known Allergies  Family History  Problem Relation Age of Onset  . Diabetes Mother   . Cancer Paternal Aunt        Bca (? breast cancer abbreviation)    Social History   Socioeconomic History  . Marital status: Married    Spouse name: Not on file  . Number of children: Not on file  . Years of education: Not on file  . Highest education level: Not on file  Social Needs  . Financial resource strain: Not on file  . Food insecurity - worry: Not on file  . Food insecurity - inability: Not on file  . Transportation needs - medical: Not on file  . Transportation needs - non-medical: Not on file  Occupational History  . Not on file  Tobacco Use  . Smoking status: Never Smoker  . Smokeless tobacco: Never Used  Substance and Sexual Activity  . Alcohol use: No  . Drug use: Yes    Types: "Crack" cocaine  . Sexual activity: Not on file  Other Topics Concern  . Not on file  Social History Narrative  .  Not on file     Constitutional: Denies fever, malaise, fatigue, headache or abrupt weight changes.  HEENT: Pt reports left ear pain. Denies eye pain, eye redness, ringing in the ears, wax buildup, runny nose, nasal congestion, bloody nose, or sore throat. Respiratory: Denies difficulty breathing, shortness of breath, cough or sputum production.     No other specific complaints in a complete review of systems (except as listed in HPI above).  Objective:   Physical Exam   BP 108/70   Pulse 80   Temp 98 F (36.7 C) (Oral)   Wt 123 lb 8 oz (56 kg)   LMP 12/17/2017 (Approximate)   BMI 24.53 kg/m  Wt Readings from Last 3 Encounters:  12/31/17 123 lb 8 oz (56 kg)  12/07/16 117 lb 8 oz (53.3 kg)  04/25/14 147 lb 2 oz (66.7 kg)    General: Appears their stated age, well developed, well nourished in NAD. Skin: Warm, dry and intact. No rashes, lesions or ulcerations noted. HEENT: Ears: Tm's gray and intact, normal light reflex, + serous effusion bilaterally, wax noted close to the TM on the left;Throat/Mouth: Teeth present, mucosa pink and moist, no exudate, lesions or ulcerations noted.  Neck:  No adenopathy noted.  BMET    Component  Value Date/Time   NA 135 05/17/2010 1730   K 4.2 05/17/2010 1730   CL 106 05/17/2010 1730   CO2 22 05/17/2010 1730   GLUCOSE 77 05/17/2010 1730   BUN 12 05/17/2010 1730   CREATININE 0.62 05/17/2010 1730   CALCIUM 9.5 05/17/2010 1730   GFRNONAA >60 05/17/2010 1730   GFRAA  05/17/2010 1730    >60        The eGFR has been calculated using the MDRD equation. This calculation has not been validated in all clinical situations. eGFR's persistently <60 mL/min signify possible Chronic Kidney Disease.    Lipid Panel     Component Value Date/Time   CHOL 170 01/20/2009 1516   TRIG 78 01/20/2009 1516   HDL 39.5 01/20/2009 1516   CHOLHDL 4.3 CALC 01/20/2009 1516   VLDL 16 01/20/2009 1516   LDLCALC 115 (H) 01/20/2009 1516    CBC    Component  Value Date/Time   WBC 14.4 (H) 04/26/2014 0605   RBC 3.25 (L) 04/26/2014 0605   HGB 10.0 (L) 04/26/2014 0605   HCT 29.6 (L) 04/26/2014 0605   PLT 178 04/26/2014 0605   MCV 91.1 04/26/2014 0605   MCH 30.5 04/26/2014 0605   MCHC 33.4 04/26/2014 0605   RDW 14.5 04/26/2014 0605   LYMPHSABS 1.6 05/08/2009 0844   MONOABS 0.5 05/08/2009 0844   EOSABS 0.1 05/08/2009 0844   BASOSABS 0.0 05/08/2009 0844    Hgb A1C No results found for: HGBA1C         Assessment & Plan:   Otalgia, Left secondary to ETD and Wax Buildup:  Advised her to start Flonase OTC Avoid Qtips Try putting a few drops of Hydrogen Peroxide in the left ear to help remove the wax No s/s of infection kc to continue Ibuprofen as needed  RTC as needed or if symptoms persist or worsen Tawan Degroote, NP

## 2017-12-31 NOTE — Patient Instructions (Signed)

## 2018-01-01 ENCOUNTER — Telehealth: Payer: Self-pay | Admitting: *Deleted

## 2018-01-01 NOTE — Telephone Encounter (Signed)
Copied from CRM [phone]. Topic: Referral - Question >> Jan 01, 2018  8:34 AM Yvonna Alanisobinson, Andra M wrote: Reason for CRM: Patient called requesting a referral to an ENT doctor. Patient stated that she saw Nicki Reaperegina Baity on yesterday. Patient stated that Rene KocherRegina was going to sent a referral to an ENT doctor. Patient wants a call back once the referral has been sent.       Thank You.Marland Kitchen..Marland Kitchen

## 2018-01-01 NOTE — Telephone Encounter (Signed)
I advised her to try the Flonase x 1 week and Hydrogen Peroxide/Debrox OTC. If no improvement we could send her to ENT. Have her call us back after trying what I recommend, if no improvement, I will refer to ENT.

## 2018-01-01 NOTE — Telephone Encounter (Signed)
Pt called back and I informed her of Baity's note

## 2018-01-21 ENCOUNTER — Telehealth: Payer: Self-pay

## 2018-01-21 DIAGNOSIS — H9202 Otalgia, left ear: Secondary | ICD-10-CM

## 2018-01-21 NOTE — Telephone Encounter (Signed)
Please see 01/01/18 phone note.

## 2018-01-21 NOTE — Telephone Encounter (Signed)
Copied from CRM [phone]. Topic: Referral - Request >> Jan 21, 2018  3:46 PM Arlyss Gandyichardson, Taren N, NT wrote: Reason for CRM: Pt calling [place]

## 2018-01-22 NOTE — Addendum Note (Signed)
Addended by: Lorre MunroeBAITY, REGINA W on: 01/22/2018 08:00 AM   Modules accepted: Orders

## 2018-01-22 NOTE — Telephone Encounter (Signed)
Referral to ENT placed

## 2018-01-24 DIAGNOSIS — H60333 Swimmer's ear, bilateral: Secondary | ICD-10-CM | POA: Diagnosis not present

## 2018-01-24 DIAGNOSIS — H9313 Tinnitus, bilateral: Secondary | ICD-10-CM | POA: Diagnosis not present

## 2018-01-24 DIAGNOSIS — H903 Sensorineural hearing loss, bilateral: Secondary | ICD-10-CM | POA: Diagnosis not present

## 2018-09-24 DIAGNOSIS — Z01419 Encounter for gynecological examination (general) (routine) without abnormal findings: Secondary | ICD-10-CM | POA: Diagnosis not present

## 2018-09-24 DIAGNOSIS — Z6823 Body mass index (BMI) 23.0-23.9, adult: Secondary | ICD-10-CM | POA: Diagnosis not present

## 2019-09-25 ENCOUNTER — Other Ambulatory Visit: Payer: Self-pay

## 2019-09-25 ENCOUNTER — Ambulatory Visit (INDEPENDENT_AMBULATORY_CARE_PROVIDER_SITE_OTHER): Payer: BC Managed Care – PPO

## 2019-09-25 DIAGNOSIS — Z23 Encounter for immunization: Secondary | ICD-10-CM

## 2019-09-30 DIAGNOSIS — Z6823 Body mass index (BMI) 23.0-23.9, adult: Secondary | ICD-10-CM | POA: Diagnosis not present

## 2019-09-30 DIAGNOSIS — Z01419 Encounter for gynecological examination (general) (routine) without abnormal findings: Secondary | ICD-10-CM | POA: Diagnosis not present

## 2019-09-30 DIAGNOSIS — R3129 Other microscopic hematuria: Secondary | ICD-10-CM | POA: Diagnosis not present

## 2019-09-30 DIAGNOSIS — Z309 Encounter for contraceptive management, unspecified: Secondary | ICD-10-CM | POA: Diagnosis not present

## 2020-11-03 ENCOUNTER — Ambulatory Visit (INDEPENDENT_AMBULATORY_CARE_PROVIDER_SITE_OTHER): Payer: No Typology Code available for payment source

## 2020-11-03 DIAGNOSIS — Z23 Encounter for immunization: Secondary | ICD-10-CM

## 2022-08-11 ENCOUNTER — Telehealth: Payer: Self-pay | Admitting: Family

## 2022-08-11 ENCOUNTER — Encounter: Payer: Self-pay | Admitting: Family

## 2022-08-11 ENCOUNTER — Ambulatory Visit: Payer: No Typology Code available for payment source | Admitting: Family

## 2022-08-11 VITALS — BP 130/68 | HR 78 | Temp 98.1°F | Resp 16 | Ht 59.0 in | Wt 118.4 lb

## 2022-08-11 DIAGNOSIS — H9193 Unspecified hearing loss, bilateral: Secondary | ICD-10-CM | POA: Insufficient documentation

## 2022-08-11 DIAGNOSIS — N926 Irregular menstruation, unspecified: Secondary | ICD-10-CM | POA: Insufficient documentation

## 2022-08-11 DIAGNOSIS — J302 Other seasonal allergic rhinitis: Secondary | ICD-10-CM

## 2022-08-11 DIAGNOSIS — H903 Sensorineural hearing loss, bilateral: Secondary | ICD-10-CM | POA: Insufficient documentation

## 2022-08-11 DIAGNOSIS — E782 Mixed hyperlipidemia: Secondary | ICD-10-CM | POA: Insufficient documentation

## 2022-08-11 DIAGNOSIS — R7303 Prediabetes: Secondary | ICD-10-CM | POA: Diagnosis not present

## 2022-08-11 DIAGNOSIS — E7800 Pure hypercholesterolemia, unspecified: Secondary | ICD-10-CM | POA: Insufficient documentation

## 2022-08-11 DIAGNOSIS — R232 Flushing: Secondary | ICD-10-CM | POA: Insufficient documentation

## 2022-08-11 DIAGNOSIS — Z8342 Family history of familial hypercholesterolemia: Secondary | ICD-10-CM | POA: Insufficient documentation

## 2022-08-11 DIAGNOSIS — H9313 Tinnitus, bilateral: Secondary | ICD-10-CM | POA: Insufficient documentation

## 2022-08-11 DIAGNOSIS — R3129 Other microscopic hematuria: Secondary | ICD-10-CM | POA: Insufficient documentation

## 2022-08-11 LAB — POCT URINE PREGNANCY: Preg Test, Ur: NEGATIVE

## 2022-08-11 NOTE — Assessment & Plan Note (Signed)
F/u with ent for ongoing f/u

## 2022-08-11 NOTE — Telephone Encounter (Signed)
Patient called in wanting to clarify if she needed the lab work in 3 months or come in sometime next week for it. Please advise. Thank you!

## 2022-08-11 NOTE — Assessment & Plan Note (Signed)
F/u with ent

## 2022-08-11 NOTE — Assessment & Plan Note (Signed)
Referral back to ent as overdue for f/u appt

## 2022-08-11 NOTE — Assessment & Plan Note (Signed)
Hormonal lab workup pending results Also hcg today pending results

## 2022-08-11 NOTE — Assessment & Plan Note (Signed)
flonase daily  Zyrtec prn

## 2022-08-11 NOTE — Assessment & Plan Note (Signed)
Lab workup for hormonal levels

## 2022-08-11 NOTE — Assessment & Plan Note (Signed)
Lipid panel ordered pending results.   

## 2022-08-11 NOTE — Patient Instructions (Addendum)
A referral was placed today for ent.  Please let us know if you have not heard back within 2 weeks about the referral.  Welcome to our clinic, I am happy to have you as my new patient. I am excited to continue on this healthcare journey with you.   I have created an order for lab work today during our visit.  Please schedule an appointment on your way out to return to the lab at your convenience. Please return fasting at your lab appointment (meaning you can only drink black coffee and or water prior to your appointment). I will reach out to you in regards to the labs when I receive the results.     Please keep in mind Any my chart messages you send have up to a three business day turnaround for a response.  Phone calls may take up to a one full business day turnaround for a  response.   If you need a medication refill I recommend you request it through the pharmacy as this is easiest for [place] rather than sending a message and or phone call.   Due to recent changes in healthcare laws, you may see results of your imaging and/or laboratory studies on MyChart before I have had a chance to review them.  I understand that in some cases there may be results that are confusing or concerning to you. Please understand that not all results are received at the same time and often I may need to interpret multiple results in order to provide you with the best plan of care or course of treatment. Therefore, I ask that you please give me 2 business days to thoroughly review all your results before contacting my office for clarification. Should we see a critical lab result, you will be contacted sooner.   It was a pleasure seeing you today! Please do not hesitate to reach out with any questions and or concerns.  Regards,   Mort Sawyers FNP-C

## 2022-08-11 NOTE — Assessment & Plan Note (Signed)
Pt advised of the following: Work on a diabetic diet, try to incorporate exercise at least 20-30 a day for 3 days a week or more.   

## 2022-08-11 NOTE — Progress Notes (Unsigned)
New Patient Office Visit  Subjective:  Patient ID: Jade Lowe, female    DOB: [birth year]  Age: 41 y.o. MRN: [id number]  CC:  Chief Complaint  Patient presents with   Establish Care    HPI Jade Lowe is here to establish care as a new patient.  Prior provider VOZ:DGUYQI Sampson Si, FNP in 2019.  Pt is without acute concerns.   Pap 2022, negative  TDAP : overdue but doesn't want today.  On birth control.   Has concerns with thinning hair, hairs falling out more than usual. Boobs often feel tender. Periods can go two months without anything at times. Last period was July 29th.   Last week with triglycerides were in 600's wanted to see if this was maybe wrong however does report family history of high cholesterol.   Trace hematuria: every time she gives a urine, saw urology in the past and stated that this was unconcerning.   chronic concerns:  Prediabetes: is worried as past h/o elevated glucose with gyn.   Hearing loss bil: was assessed by Ent Dr. Jearld Fenton in 2019, however covid occurred and pt did not return as instructed. He had recommended three month audiology f/u and possible mri due to abn hearing loss for her age . She is still with chronic tinnitus and hearing loss and wants to pursue follow up.   Past Medical History:  Diagnosis Date   Pregnancy induced hypertension     Past Surgical History:  Procedure Laterality Date   CESAREAN SECTION  04/2010   [facility]   CESAREAN SECTION N/A 04/25/2014   Procedure: CESAREAN SECTION;  Surgeon: Mitchel Honour, DO;  Location: [facility];  Service: Obstetrics;  Laterality: N/A;    Family History  Problem Relation Age of Onset   Diabetes Mother    Interstitial cystitis Mother    Healthy Father    Diabetes Maternal Grandmother    Diabetes Paternal Grandmother    Breast cancer Paternal Aunt     Social History   Socioeconomic History   Marital status: Married    Spouse name: Not on file   Number of children: 2   Years of  education: Not on file   Highest education level: Not on file  Occupational History    Employer: [employer]    Comment: supervisor  Tobacco Use   Smoking status: Never   Smokeless tobacco: Never  Vaping Use   Vaping Use: Never used  Substance and Sexual Activity   Alcohol use: No   Drug use: Yes    Types: "Crack" cocaine   Sexual activity: Yes  Other Topics Concern   Not on file  Social History Narrative   Not on file   Social Determinants of Health   Financial Resource Strain: Not on file  Food Insecurity: Not on file  Transportation Needs: Not on file  Physical Activity: Not on file  Stress: Not on file  Social Connections: Not on file  Intimate Partner Violence: Not on file    Outpatient Medications Prior to Visit  Medication Sig Dispense Refill   LAYOLIS FE 0.8-25 MG-MCG tablet Chew 1 tablet by mouth daily.      albuterol (PROVENTIL HFA;VENTOLIN HFA) 108 (90 Base) MCG/ACT inhaler Inhale 2 puffs into the lungs every 6 (six) hours as needed for wheezing or shortness of breath. (Patient not taking: Reported on 12/31/2017) 1 Inhaler 0   No facility-administered medications prior to visit.    No Known Allergies  ROS Review of  Systems  Review of Systems  Respiratory:  Negative for shortness of breath.   Cardiovascular:  Negative for chest pain and palpitations.  Gastrointestinal:  Negative for constipation and diarrhea.  Genitourinary:  Negative for dysuria, frequency and urgency.  Musculoskeletal:  Negative for myalgias.  Psychiatric/Behavioral:  Negative for depression and suicidal ideas.   All other systems reviewed and are negative.    Objective:    Physical Exam  Gen: NAD, resting comfortably CV: RRR with no murmurs appreciated Pulm: NWOB, CTAB with no crackles, wheezes, or rhonchi Skin: warm, dry Psych: Normal affect and thought content  BP 130/68   Pulse 78   Temp 98.1 F (36.7 C)   Resp 16   Ht 4\' 11"  (1.499 m)   Wt 118 lb 6 oz  (53.7 kg)   LMP 07/21/2022 (Exact Date)   SpO2 98%   BMI 23.91 kg/m  Wt Readings from Last 3 Encounters:  08/11/22 118 lb 6 oz (53.7 kg)  12/31/17 123 lb 8 oz (56 kg)  12/07/16 117 lb 8 oz (53.3 kg)     Health Maintenance Due  Topic Date Due   Hepatitis C Screening  Never done   TETANUS/TDAP  01/20/2019   INFLUENZA VACCINE  07/25/2022    There are no preventive care reminders to display for this patient.  Lab Results  Component Value Date   TSH 1.48 01/20/2009   Lab Results  Component Value Date   WBC 14.4 (H) 04/26/2014   HGB 10.0 (L) 04/26/2014   HCT 29.6 (L) 04/26/2014   MCV 91.1 04/26/2014   PLT 178 04/26/2014   Lab Results  Component Value Date   NA 135 05/17/2010   K 4.2 05/17/2010   CO2 22 05/17/2010   GLUCOSE 77 05/17/2010   BUN 12 05/17/2010   CREATININE 0.62 05/17/2010   BILITOT 0.4 05/17/2010   ALKPHOS 106 05/17/2010   AST 26 05/17/2010   ALT 21 05/17/2010   PROT 6.7 05/17/2010   ALBUMIN 3.2 (L) 05/17/2010   CALCIUM 9.5 05/17/2010   Lab Results  Component Value Date   CHOL 170 01/20/2009   Lab Results  Component Value Date   HDL 39.5 01/20/2009   Lab Results  Component Value Date   LDLCALC 115 (H) 01/20/2009   Lab Results  Component Value Date   TRIG 78 01/20/2009   Lab Results  Component Value Date   CHOLHDL 4.3 CALC 01/20/2009   No results found for: "HGBA1C"    Assessment & Plan:   Problem List Items Addressed This Visit       Cardiovascular and Mediastinum   Hot flashes    Lab workup for hormonal levels      Relevant Orders   Follicle stimulating hormone   TSH   Prolactin   Testos,Total,Free and SHBG (Female)     Respiratory   Seasonal allergic rhinitis - Primary    flonase daily  Zyrtec prn       Relevant Orders   CBC   Comprehensive metabolic panel     Nervous and Auditory   Bilateral hearing loss    Referral back to ent as overdue for f/u appt      Relevant Orders   Ambulatory referral to ENT    Sensorineural hearing loss (SNHL) of both ears    F/u with ent for ongoing f/u       Relevant Orders   Ambulatory referral to ENT     Genitourinary   Microscopic hematuria  Other   Prediabetes    Pt advised of the following: Work on a diabetic diet, try to incorporate exercise at least 20-30 a day for 3 days a week or more.        Relevant Orders   CBC   Bilateral tinnitus    F/u with ent       Relevant Orders   Ambulatory referral to ENT   Elevated LDL cholesterol level    Lipid panel ordered pending results.        Irregular periods/menstrual cycles    Hormonal lab workup pending results Also hcg today pending results      Relevant Orders   Follicle stimulating hormone   TSH   Prolactin   Testos,Total,Free and SHBG (Female)   POCT urine pregnancy   Elevated triglycerides with high cholesterol    Lipid panel ordered pending results.        Relevant Orders   Lipid panel   Family history of high cholesterol    No orders of the defined types were placed in this encounter.   Follow-up: Return in about 3 months (around 11/11/2022) for follow up on lab work .    Eugenia Pancoast, FNP

## 2022-08-14 ENCOUNTER — Encounter: Payer: Self-pay | Admitting: Family

## 2022-08-14 NOTE — Assessment & Plan Note (Signed)
Cholesterol handout given to pt.  Work on diet and exercise as tolerated

## 2022-08-14 NOTE — Assessment & Plan Note (Signed)
workup negative for urology in past. Will f/u as needed.

## 2022-08-14 NOTE — Telephone Encounter (Signed)
I looked at your notes about coming back in 3 months for lab follow up, but pt said that was her whole reason for coming last week.

## 2022-08-15 ENCOUNTER — Encounter: Payer: Self-pay | Admitting: Family

## 2022-08-15 NOTE — Telephone Encounter (Signed)
Is this the case? Lab apts booked up until November? She came in for regular appt and I told her to come back fasting. She is saying no availability until November. I haven't had this issue with anyone else so I'm curious if it's just her being picky with her schedule?

## 2022-08-17 ENCOUNTER — Other Ambulatory Visit (INDEPENDENT_AMBULATORY_CARE_PROVIDER_SITE_OTHER): Payer: No Typology Code available for payment source

## 2022-08-17 DIAGNOSIS — J302 Other seasonal allergic rhinitis: Secondary | ICD-10-CM

## 2022-08-17 DIAGNOSIS — E782 Mixed hyperlipidemia: Secondary | ICD-10-CM | POA: Diagnosis not present

## 2022-08-17 DIAGNOSIS — R7303 Prediabetes: Secondary | ICD-10-CM

## 2022-08-17 DIAGNOSIS — R232 Flushing: Secondary | ICD-10-CM | POA: Diagnosis not present

## 2022-08-17 DIAGNOSIS — N926 Irregular menstruation, unspecified: Secondary | ICD-10-CM | POA: Diagnosis not present

## 2022-08-17 LAB — TSH: TSH: 2.17 u[IU]/mL (ref 0.35–5.50)

## 2022-08-17 LAB — FOLLICLE STIMULATING HORMONE: FSH: 0.2 m[IU]/mL

## 2022-08-17 LAB — LIPID PANEL
Cholesterol: 205 mg/dL — ABNORMAL HIGH (ref 0–200)
HDL: 46.3 mg/dL (ref >39.00)
LDL Cholesterol: 128 mg/dL — ABNORMAL HIGH (ref 0–99)
NonHDL: 159.12
Total CHOL/HDL Ratio: 4
Triglycerides: 155 mg/dL — ABNORMAL HIGH (ref 0.0–149.0)
VLDL: 31 mg/dL (ref 0.0–40.0)

## 2022-08-17 LAB — COMPREHENSIVE METABOLIC PANEL
ALT: 14 U/L (ref 0–35)
AST: 14 U/L (ref 0–37)
Albumin: 4.2 g/dL (ref 3.5–5.2)
Alkaline Phosphatase: 59 U/L (ref 39–117)
BUN: 14 mg/dL (ref 6–23)
CO2: 28 mEq/L (ref 19–32)
Calcium: 9.3 mg/dL (ref 8.4–10.5)
Chloride: 103 mEq/L (ref 96–112)
Creatinine, Ser: 0.78 mg/dL (ref 0.40–1.20)
GFR: 94.4 mL/min (ref >60.00)
Glucose, Bld: 95 mg/dL (ref 70–99)
Potassium: 4.1 mEq/L (ref 3.5–5.1)
Sodium: 138 mEq/L (ref 135–145)
Total Bilirubin: 0.7 mg/dL (ref 0.2–1.2)
Total Protein: 6.9 g/dL (ref 6.0–8.3)

## 2022-08-17 LAB — CBC
HCT: 40.6 % (ref 36.0–46.0)
Hemoglobin: 13.5 g/dL (ref 12.0–15.0)
MCHC: 33.4 g/dL (ref 30.0–36.0)
MCV: 94 fl (ref 78.0–100.0)
Platelets: 292 10*3/uL (ref 150.0–400.0)
RBC: 4.32 Mil/uL (ref 3.87–5.11)
RDW: 13.4 % (ref 11.5–15.5)
WBC: 7.2 10*3/uL (ref 4.0–10.5)

## 2022-08-18 ENCOUNTER — Encounter: Payer: Self-pay | Admitting: *Deleted

## 2022-08-21 ENCOUNTER — Encounter: Payer: Self-pay | Admitting: Family

## 2022-08-21 ENCOUNTER — Other Ambulatory Visit: Payer: Self-pay | Admitting: Family

## 2022-08-21 DIAGNOSIS — R7303 Prediabetes: Secondary | ICD-10-CM

## 2022-08-21 DIAGNOSIS — E7800 Pure hypercholesterolemia, unspecified: Secondary | ICD-10-CM

## 2022-08-29 LAB — TESTOS,TOTAL,FREE AND SHBG (FEMALE): Testosterone, Total, LC-MS-MS: 44 ng/dL (ref 2–45)

## 2022-08-29 LAB — PROLACTIN: Prolactin: 7.7 ng/mL

## 2022-11-13 ENCOUNTER — Other Ambulatory Visit (INDEPENDENT_AMBULATORY_CARE_PROVIDER_SITE_OTHER): Payer: No Typology Code available for payment source

## 2022-11-13 DIAGNOSIS — E7800 Pure hypercholesterolemia, unspecified: Secondary | ICD-10-CM | POA: Diagnosis not present

## 2022-11-13 DIAGNOSIS — R7303 Prediabetes: Secondary | ICD-10-CM

## 2022-11-13 LAB — LIPID PANEL
Cholesterol: 226 mg/dL — ABNORMAL HIGH (ref 0–200)
HDL: 55.6 mg/dL (ref >39.00)
LDL Cholesterol: 152 mg/dL — ABNORMAL HIGH (ref 0–99)
NonHDL: 170.27
Total CHOL/HDL Ratio: 4
Triglycerides: 90 mg/dL (ref 0.0–149.0)
VLDL: 18 mg/dL (ref 0.0–40.0)

## 2022-11-13 LAB — HEMOGLOBIN A1C: Hgb A1c MFr Bld: 5.7 % (ref 4.6–6.5)

## 2022-11-20 ENCOUNTER — Ambulatory Visit: Payer: No Typology Code available for payment source | Admitting: Family

## 2022-11-20 ENCOUNTER — Encounter: Payer: Self-pay | Admitting: Family

## 2023-01-29 ENCOUNTER — Other Ambulatory Visit: Payer: Self-pay | Admitting: Obstetrics and Gynecology

## 2023-01-29 DIAGNOSIS — R928 Other abnormal and inconclusive findings on diagnostic imaging of breast: Secondary | ICD-10-CM

## 2023-02-16 ENCOUNTER — Ambulatory Visit
Admission: RE | Admit: 2023-02-16 | Discharge: 2023-02-16 | Disposition: A | Discharge status: Home / Self Care | Payer: No Typology Code available for payment source | Source: Ambulatory Visit | Attending: Obstetrics and Gynecology | Admitting: Obstetrics and Gynecology

## 2023-02-16 DIAGNOSIS — R928 Other abnormal and inconclusive findings on diagnostic imaging of breast: Secondary | ICD-10-CM
